# Patient Record
Sex: Female | Born: 1985 | Race: White | Hispanic: No | Smoking: Never smoker
Health system: Southern US, Community
[De-identification: ages and names within clinical notes are randomized; demographics above are authoritative.]

## PROBLEM LIST (undated history)

## (undated) DIAGNOSIS — G43909 Migraine, unspecified, not intractable, without status migrainosus: Secondary | ICD-10-CM

## (undated) HISTORY — DX: Migraine, unspecified, not intractable, without status migrainosus: G43.909

## (undated) HISTORY — PX: WISDOM TOOTH EXTRACTION: SHX21

---

## 2010-10-09 ENCOUNTER — Emergency Department (HOSPITAL_COMMUNITY)
Admission: EM | Admit: 2010-10-09 | Discharge: 2010-10-09 | Disposition: A | Discharge status: Home / Self Care | Payer: BC Managed Care – PPO | Attending: Emergency Medicine | Admitting: Emergency Medicine

## 2010-10-09 DIAGNOSIS — M79609 Pain in unspecified limb: Secondary | ICD-10-CM | POA: Insufficient documentation

## 2010-10-09 DIAGNOSIS — L03119 Cellulitis of unspecified part of limb: Secondary | ICD-10-CM | POA: Insufficient documentation

## 2010-10-09 DIAGNOSIS — M7989 Other specified soft tissue disorders: Secondary | ICD-10-CM | POA: Insufficient documentation

## 2010-10-09 DIAGNOSIS — L02619 Cutaneous abscess of unspecified foot: Secondary | ICD-10-CM | POA: Insufficient documentation

## 2010-10-09 LAB — CBC
Hemoglobin: 14.3 g/dL (ref 12.0–15.0)
MCHC: 34.7 g/dL (ref 30.0–36.0)
Platelets: 195 10*3/uL (ref 150–400)
RBC: 4.31 MIL/uL (ref 3.87–5.11)

## 2010-10-09 LAB — POCT I-STAT, CHEM 8
Glucose, Bld: 81 mg/dL (ref 70–99)
HCT: 43 % (ref 36.0–46.0)
Hemoglobin: 14.6 g/dL (ref 12.0–15.0)
Potassium: 4.2 mEq/L (ref 3.5–5.1)
Sodium: 139 mEq/L (ref 135–145)
TCO2: 24 mmol/L (ref 0–100)

## 2010-12-09 ENCOUNTER — Other Ambulatory Visit (HOSPITAL_COMMUNITY)
Admission: RE | Admit: 2010-12-09 | Discharge: 2010-12-09 | Disposition: A | Discharge status: Home / Self Care | Payer: BC Managed Care – PPO | Source: Ambulatory Visit | Attending: Family Medicine | Admitting: Family Medicine

## 2010-12-09 DIAGNOSIS — Z01419 Encounter for gynecological examination (general) (routine) without abnormal findings: Secondary | ICD-10-CM | POA: Insufficient documentation

## 2013-09-22 ENCOUNTER — Ambulatory Visit: Payer: BC Managed Care – PPO | Admitting: Family Medicine

## 2013-09-22 VITALS — BP 108/78 | HR 84 | Temp 97.7°F | Resp 16 | Ht 63.0 in | Wt 190.8 lb

## 2013-09-22 DIAGNOSIS — J40 Bronchitis, not specified as acute or chronic: Principal | ICD-10-CM

## 2013-09-22 DIAGNOSIS — J329 Chronic sinusitis, unspecified: Secondary | ICD-10-CM

## 2013-09-22 DIAGNOSIS — R059 Cough, unspecified: Secondary | ICD-10-CM

## 2013-09-22 DIAGNOSIS — R05 Cough: Secondary | ICD-10-CM

## 2013-09-22 MED ORDER — AZITHROMYCIN 250 MG PO TABS: ORAL_TABLET | ORAL | Status: DC

## 2013-09-22 NOTE — Progress Notes (Signed)
Subjective:    Patient ID: Jackie Price, female    DOB: 25-May-1985, 28 y.o.   MRN: [id number]  HPI Chief Complaint  Patient presents with  . Illness    cough and congestion x 10 days    This chart was scribed for Meredith Staggers, MD by Andrew Au, ED Scribe. This patient was seen in room 8 and the patient's care was started at 10:20 AM.  HPI Comments: Jackie Stuewe is a 28 y.o. female who presents to the [facility] complaining of cough and congestion onset 10 days. Pt reports symptoms started with cough and sore throat that later moved to her chest causing chest congestion. She has had nasal drainage consisting of yellow mucous. Pt states she has felt warm but is unsure if she has had a fever. She reports her head congestion is worse today. Pt states she has been making progress within the last week but symptoms worsened have in the past 2 days. Pt has taken nyquil for symptoms 1 day ago without relief to symptoms. Pt denies h/o sinus infections. She denies allergies.   Pt denies sick contacts.  There are no active problems to display for this patient.  History reviewed. No pertinent past medical history. Past Surgical History  Procedure Laterality Date  . Wisdom tooth extraction     Allergies no known allergies Prior to Admission medications   Medication Sig Start Date End Date Taking? Authorizing Provider  DM-Doxylamine-Acetaminophen (NYQUIL COLD & FLU PO) Take by mouth.   Yes Historical Provider, MD   History   Social History  . Marital Status: Married    Spouse Name: N/A    Number of Children: N/A  . Years of Education: N/A   Occupational History  . Not on file.   Social History Main Topics  . Smoking status: Never Smoker   . Smokeless tobacco: Not on file  . Alcohol Use: Yes  . Drug Use: No  . Sexual Activity: Not on file   Other Topics Concern  . Not on file   Social History Narrative  . No narrative on file   Review of Systems  HENT:  Positive for congestion, sinus pressure and sore throat.   Respiratory: Positive for cough.   Allergic/Immunologic: Negative for environmental allergies.      Objective:   Physical Exam  Vitals reviewed. Constitutional: She is oriented to person, place, and time. She appears well-developed and well-nourished. No distress.  HENT:  Head: Normocephalic and atraumatic.  Right Ear: Hearing, tympanic membrane, external ear and ear canal normal.  Left Ear: Hearing, tympanic membrane, external ear and ear canal normal.  Nose: Nose normal.  Mouth/Throat: Oropharynx is clear and moist. No oropharyngeal exudate.  Slight pressure in maxillary sinus but not painful  Eyes: Conjunctivae and EOM are normal. Pupils are equal, round, and reactive to light.  Cardiovascular: Normal rate, regular rhythm, normal heart sounds and intact distal pulses.   No murmur heard. Pulmonary/Chest: Effort normal and breath sounds normal. No respiratory distress. She has no wheezes. She has no rhonchi.  Neurological: She is alert and oriented to person, place, and time.  Skin: Skin is warm and dry. No rash noted.  Psychiatric: She has a normal mood and affect. Her behavior is normal.       Assessment & Plan:   1. Sinobronchitis   2. Cough    Suspected initial URI, with secondary sickening past 2 days, sinus and chest sx's.  No discreet sinus  ttp, less likely acute sinus infection - covered with Zpak, start mucinex, saline ns. Fluids, relative rest, rtc precautions.   Meds ordered this encounter  Medications  . DM-Doxylamine-Acetaminophen (NYQUIL COLD & FLU PO)    Sig: Take by mouth.  Marland Kitchen. azithromycin (ZITHROMAX) 250 MG tablet    Sig: Take 2 pills by mouth on day 1, then 1 pill by mouth per day on days 2 through 5.    Dispense:  6 each    Refill:  0  I personally performed the services described in this documentation, which was scribed in my presence. The recorded information has been reviewed and considered, and  addended by me as needed.

## 2013-09-22 NOTE — Patient Instructions (Signed)
Saline nasal spray atleast 4 times per day, over the counter mucinex or mucinex DM, drink plenty of fluids.  Return to the clinic or go to the nearest emergency room if any of your symptoms worsen or new symptoms occur. Sinusitis Sinusitis is redness, soreness, and swelling (inflammation) of the paranasal sinuses. Paranasal sinuses are air pockets within the bones of your face (beneath the eyes, the middle of the forehead, or above the eyes). In healthy paranasal sinuses, mucus is able to drain out, and air is able to circulate through them by way of your nose. However, when your paranasal sinuses are inflamed, mucus and air can become trapped. This can allow bacteria and other germs to grow and cause infection. Sinusitis can develop quickly and last only a short time (acute) or continue over a long period (chronic). Sinusitis that lasts for more than 12 weeks is considered chronic.  CAUSES  Causes of sinusitis include:  Allergies.  Structural abnormalities, such as displacement of the cartilage that separates your nostrils (deviated septum), which can decrease the air flow through your nose and sinuses and affect sinus drainage.  Functional abnormalities, such as when the small hairs (cilia) that line your sinuses and help remove mucus do not work properly or are not present. SYMPTOMS  Symptoms of acute and chronic sinusitis are the same. The primary symptoms are pain and pressure around the affected sinuses. Other symptoms include:  Upper toothache.  Earache.  Headache.  Bad breath.  Decreased sense of smell and taste.  A cough, which worsens when you are lying flat.  Fatigue.  Fever.  Thick drainage from your nose, which often is green and may contain pus (purulent).  Swelling and warmth over the affected sinuses. DIAGNOSIS  Your caregiver will perform a physical exam. During the exam, your caregiver may:  Look in your nose for signs of abnormal growths in your nostrils (nasal  polyps).  Tap over the affected sinus to check for signs of infection.  View the inside of your sinuses (endoscopy) with a special imaging device with a light attached (endoscope), which is inserted into your sinuses. If your caregiver suspects that you have chronic sinusitis, one or more of the following tests may be recommended:  Allergy tests.  Nasal culture A sample of mucus is taken from your nose and sent to a lab and screened for bacteria.  Nasal cytology A sample of mucus is taken from your nose and examined by your caregiver to determine if your sinusitis is related to an allergy. TREATMENT  Most cases of acute sinusitis are related to a viral infection and will resolve on their own within 10 days. Sometimes medicines are prescribed to help relieve symptoms (pain medicine, decongestants, nasal steroid sprays, or saline sprays).  However, for sinusitis related to a bacterial infection, your caregiver will prescribe antibiotic medicines. These are medicines that will help kill the bacteria causing the infection.  Rarely, sinusitis is caused by a fungal infection. In theses cases, your caregiver will prescribe antifungal medicine. For some cases of chronic sinusitis, surgery is needed. Generally, these are cases in which sinusitis recurs more than 3 times per year, despite other treatments. HOME CARE INSTRUCTIONS   Drink plenty of water. Water helps thin the mucus so your sinuses can drain more easily.  Use a humidifier.  Inhale steam 3 to 4 times a day (for example, sit in the bathroom with the shower running).  Apply a warm, moist washcloth to your face 3 to 4  times a day, or as directed by your caregiver.  Use saline nasal sprays to help moisten and clean your sinuses.  Take over-the-counter or prescription medicines for pain, discomfort, or fever only as directed by your caregiver. SEEK IMMEDIATE MEDICAL CARE IF:  You have increasing pain or severe headaches.  You have  nausea, vomiting, or drowsiness.  You have swelling around your face.  You have vision problems.  You have a stiff neck.  You have difficulty breathing. MAKE SURE YOU:   Understand these instructions.  Will watch your condition.  Will get help right away if you are not doing well or get worse. Document Released: 04/05/2005 Document Revised: 06/28/2011 Document Reviewed: 04/20/2011 [facility] Patient Information 2014 [place], [place].    Bronchitis Bronchitis is inflammation of the airways that extend from the windpipe into the lungs (bronchi). The inflammation often causes mucus to develop, which leads to a cough. If the inflammation becomes severe, it may cause shortness of breath. CAUSES  Bronchitis may be caused by:   Viral infections.   Bacteria.   Cigarette smoke.   Allergens, pollutants, and other irritants.  SIGNS AND SYMPTOMS  The most common symptom of bronchitis is a frequent cough that produces mucus. Other symptoms include:  Fever.   Body aches.   Chest congestion.   Chills.   Shortness of breath.   Sore throat.  DIAGNOSIS  Bronchitis is usually diagnosed through a medical history and physical exam. Tests, such as chest X-rays, are sometimes done to rule out other conditions.  TREATMENT  You may need to avoid contact with whatever caused the problem (smoking, for example). Medicines are sometimes needed. These may include:  Antibiotics. These may be prescribed if the condition is caused by bacteria.  Cough suppressants. These may be prescribed for relief of cough symptoms.   Inhaled medicines. These may be prescribed to help open your airways and make it easier for you to breathe.   Steroid medicines. These may be prescribed for those with recurrent (chronic) bronchitis. HOME CARE INSTRUCTIONS  Get plenty of rest.   Drink enough fluids to keep your urine clear or pale yellow (unless you have a medical condition that requires fluid  restriction). Increasing fluids may help thin your secretions and will prevent dehydration.   Only take over-the-counter or prescription medicines as directed by your health care provider.  Only take antibiotics as directed. Make sure you finish them even if you start to feel better.  Avoid secondhand smoke, irritating chemicals, and strong fumes. These will make bronchitis worse. If you are a smoker, quit smoking. Consider using nicotine gum or skin patches to help control withdrawal symptoms. Quitting smoking will help your lungs heal faster.   Put a cool-mist humidifier in your bedroom at night to moisten the air. This may help loosen mucus. Change the water in the humidifier daily. You can also run the hot water in your shower and sit in the bathroom with the door closed for 5 10 minutes.   Follow up with your health care provider as directed.   Wash your hands frequently to avoid catching bronchitis again or spreading an infection to others.  SEEK MEDICAL CARE IF: Your symptoms do not improve after 1 week of treatment.  SEEK IMMEDIATE MEDICAL CARE IF:  Your fever increases.  You have chills.   You have chest pain.   You have worsening shortness of breath.   You have bloody sputum.  You faint.  You have lightheadedness.  You have  a severe headache.   You vomit repeatedly. MAKE SURE YOU:   Understand these instructions.  Will watch your condition.  Will get help right away if you are not doing well or get worse. Document Released: 04/05/2005 Document Revised: 01/24/2013 Document Reviewed: 11/28/2012 [facility] Patient Information 2014 [place], [place].

## 2014-01-15 ENCOUNTER — Encounter: Payer: Self-pay | Admitting: Family Medicine

## 2014-01-15 ENCOUNTER — Ambulatory Visit (INDEPENDENT_AMBULATORY_CARE_PROVIDER_SITE_OTHER): Payer: BC Managed Care – PPO

## 2014-01-15 ENCOUNTER — Ambulatory Visit (INDEPENDENT_AMBULATORY_CARE_PROVIDER_SITE_OTHER): Payer: BC Managed Care – PPO | Admitting: Family Medicine

## 2014-01-15 VITALS — BP 108/80 | HR 91 | Temp 97.9°F | Resp 16 | Ht 63.0 in | Wt 205.2 lb

## 2014-01-15 DIAGNOSIS — R0789 Other chest pain: Secondary | ICD-10-CM

## 2014-01-15 DIAGNOSIS — R071 Chest pain on breathing: Secondary | ICD-10-CM

## 2014-01-15 NOTE — Progress Notes (Signed)
Is a 28 year old employee of [employer]. [facility] comes in with per week of right upper sternal chest pain, tender patient.  No pulmonary history such as asthma, smoking.  Patient has no history of trauma.  Patient has no history of rash in that area.  Objective: Patient mildly tender at the manubrial his on the right side. Her chest is clear. Skin is clear.  [facility] reading (PRIMARY) by  Dr. Milus GlazierLauenstein. Negative chest  Assessment: Mild pectoral strain versus costochondritis  Plan: Voltaren 5 mg twice a day.  Return as necessary  Signed, Elvina SidleKurt Makana Feigel M.D..

## 2014-11-20 ENCOUNTER — Encounter: Payer: Self-pay | Admitting: Obstetrics and Gynecology

## 2014-11-20 ENCOUNTER — Ambulatory Visit (INDEPENDENT_AMBULATORY_CARE_PROVIDER_SITE_OTHER): Payer: 59 | Admitting: Obstetrics and Gynecology

## 2014-11-20 VITALS — BP 122/80 | HR 80 | Resp 16 | Ht 63.0 in | Wt 207.0 lb

## 2014-11-20 DIAGNOSIS — Z01419 Encounter for gynecological examination (general) (routine) without abnormal findings: Secondary | ICD-10-CM | POA: Diagnosis not present

## 2014-11-20 DIAGNOSIS — Z833 Family history of diabetes mellitus: Secondary | ICD-10-CM

## 2014-11-20 DIAGNOSIS — Z3009 Encounter for other general counseling and advice on contraception: Secondary | ICD-10-CM

## 2014-11-20 DIAGNOSIS — R635 Abnormal weight gain: Secondary | ICD-10-CM | POA: Diagnosis not present

## 2014-11-20 DIAGNOSIS — Z0000 Encounter for general adult medical examination without abnormal findings: Secondary | ICD-10-CM | POA: Diagnosis not present

## 2014-11-20 DIAGNOSIS — Z124 Encounter for screening for malignant neoplasm of cervix: Secondary | ICD-10-CM | POA: Diagnosis not present

## 2014-11-20 DIAGNOSIS — G43109 Migraine with aura, not intractable, without status migrainosus: Secondary | ICD-10-CM | POA: Diagnosis not present

## 2014-11-20 DIAGNOSIS — R319 Hematuria, unspecified: Secondary | ICD-10-CM | POA: Diagnosis not present

## 2014-11-20 DIAGNOSIS — Z113 Encounter for screening for infections with a predominantly sexual mode of transmission: Secondary | ICD-10-CM

## 2014-11-20 LAB — POCT URINALYSIS DIPSTICK
BILIRUBIN UA: NEGATIVE
Glucose, UA: NEGATIVE
KETONES UA: NEGATIVE
LEUKOCYTES UA: NEGATIVE
Nitrite, UA: NEGATIVE
PROTEIN UA: NEGATIVE
Spec Grav, UA: 1.02
Urobilinogen, UA: NEGATIVE
pH, UA: 7

## 2014-11-20 LAB — POCT URINE PREGNANCY: Preg Test, Ur: NEGATIVE

## 2014-11-20 NOTE — Progress Notes (Signed)
Patient ID: Jackie Price, female   DOB: Oct 27, 1985, 29 y.o.   MRN: [id number] 29 y.o. No obstetric history on file. MarriedCaucasianF here for annual exam. Pt is wanting to get started on birth control. She use to take OCP but did not fell well on them. Pt also c/o migraines she feels is hormonal. She does have visual changes with her migraine, she has had some numbness in her hands and face. She hasn't seen a neurologist. Slightly aphasic. Migraines occur 2-3 x a week, complex about 2 x a month. The only thing that helps is going to bed.  Menses q month x 2-3 days, 1 day is heavy. Saturates a super tampon in 2 hours at most. She has midcycle spotting the last 3-4 months, lasts x 1 day.  Sexually active, same partner x 2 years. Using W/D for contraception.  She c/o a 50 lb weight gain in the last year. She has improved her exercise and diet, still gaining weight.   Patient's last menstrual period was 11/01/2014.          Sexually active: Yes.    The current method of family planning is none.    Exercising: Yes.    walking  Smoker:  no  Health Maintenance: Pap:  4 years ago WNL per patient History of abnormal Pap:  no MMG:  N/A Colonoscopy:  N/A BMD:   N/A TDaP:  2014 Screening Labs: labs drawn today, Hb today: 14.2, Urine today: +blood   reports that she has never smoked. She has never used smokeless tobacco. She reports that she drinks about 0.6 - 1.8 oz of alcohol per week. She reports that she does not use illicit drugs.  Past Medical History  Diagnosis Date  . Migraine     Past Surgical History  Procedure Laterality Date  . Wisdom tooth extraction      No current outpatient prescriptions on file.   No current facility-administered medications for this visit.    Family History  Problem Relation Age of Onset  . Diabetes Father   . Congestive Heart Failure Father   . Heart disease Father   . Heart disease Maternal Grandmother   . Hypertension Maternal Grandmother   .  Alzheimer's disease Maternal Grandmother   . Hypertension Maternal Grandfather   . Cancer Maternal Grandfather   . Liver cancer Maternal Grandfather    Dad with heart failure in his mid 19's. Has AODM, overweight. ROS:  Pertinent items are noted in HPI.  Otherwise, a comprehensive ROS was negative.  Exam:   BP 122/80 mmHg  Pulse 80  Resp 16  Ht 5\' 3"  (1.6 m)  Wt 207 lb (93.895 kg)  BMI 36.68 kg/m2  LMP 11/01/2014  Weight change: @WEIGHTCHANGE @ Height:   Height: 5\' 3"  (160 cm)  Ht Readings from Last 3 Encounters:  11/20/14 5\' 3"  (1.6 m)  01/15/14 5\' 3"  (1.6 m)  09/22/13 5\' 3"  (1.6 m)    General appearance: alert, cooperative and appears stated age Head: Normocephalic, without obvious abnormality, atraumatic Neck: no adenopathy, supple, symmetrical, trachea midline and thyroid normal to inspection and palpation Lungs: clear to auscultation bilaterally Breasts: normal appearance, no masses or tenderness Heart: regular rate and rhythm Abdomen: soft, non-tender; bowel sounds normal; no masses,  no organomegaly Extremities: extremities normal, atraumatic, no cyanosis or edema Skin: Skin color, texture, turgor normal. No rashes or lesions Lymph nodes: Cervical, supraclavicular, and axillary nodes normal. No abnormal inguinal nodes palpated Neurologic: Grossly normal   Pelvic: External genitalia:  no lesions              Urethra:  normal appearing urethra with no masses, tenderness or lesions              Bartholins and Skenes: normal                 Vagina: normal appearing vagina with normal color and discharge, no lesions              Cervix: no lesions              Pap taken: Yes.   Bimanual Exam:  Uterus:  normal size, contour, position, consistency, mobility, non-tender              Adnexa: normal adnexa and no mass, fullness, tenderness               Rectovaginal: Confirms               Anus:  normal sphincter tone, no lesions  Chaperone was present for exam.  A:  Well  Woman with normal exam  STD testing  Contraception management  Complex migraines with stroke like symptoms  Weight gain (50 lbs)  P:   Pap with reflex hpv  STD testing  TSH, Lipid panel, HgbA1C  Discussed options for contraception, she can't be on combination OCP's. We discussed the mini-pill and nexplanon. I think the best option for her would be the paragaurd IUD. Information given on the paragaurd and mirena  Condoms encouraged  Referral to Neurology  Cc: Neurology

## 2014-11-20 NOTE — Patient Instructions (Signed)
EXERCISE AND DIET:  We recommended that you start or continue a regular exercise program for good health. Regular exercise means any activity that makes your heart beat faster and makes you sweat.  We recommend exercising at least 30 minutes per day at least 3 days a week, preferably 4 or 5.  We also recommend a diet low in fat and sugar.  Inactivity, poor dietary choices and obesity can cause diabetes, heart attack, stroke, and kidney damage, among others.    ALCOHOL AND SMOKING:  Women should limit their alcohol intake to no more than 7 drinks/beers/glasses of wine (combined, not each!) per week. Moderation of alcohol intake to this level decreases your risk of breast cancer and liver damage. And of course, no recreational drugs are part of a healthy lifestyle.  And absolutely no smoking or even second hand smoke. Most people know smoking can cause heart and lung diseases, but did you know it also contributes to weakening of your bones? Aging of your skin?  Yellowing of your teeth and nails?    CALCIUM AND VITAMIN D:  Adequate intake of calcium and Vitamin D are recommended.  The recommendations for exact amounts of these supplements seem to change often, but generally speaking 600 mg of calcium (either carbonate or citrate) and 800 units of Vitamin D per day seems prudent. Certain women may benefit from higher intake of Vitamin D.  If you are among these women, your doctor will have told you during your visit.    PAP SMEARS:  Pap smears, to check for cervical cancer or precancers,  have traditionally been done yearly, although recent scientific advances have shown that most women can have pap smears less often.  However, every woman still should have a physical exam from her gynecologist every year. It will include a breast check, inspection of the vulva and vagina to check for abnormal growths or skin changes, a visual exam of the cervix, and then an exam to evaluate the size and shape of the uterus and  ovaries.  And after 27 years of age, a rectal exam is indicated to check for rectal cancers. We will also provide age appropriate advice regarding health maintenance, like when you should have certain vaccines, screening for sexually transmitted diseases, bone density testing, colonoscopy, mammograms, etc.   MAMMOGRAMS:  All women over 75 years old should have a yearly mammogram. Many facilities now offer a "3D" mammogram, which may cost around $50 extra out of pocket. If possible,  we recommend you accept the option to have the 3D mammogram performed.  It both reduces the number of women who will be called back for extra views which then turn out to be normal, and it is better than the routine mammogram at detecting truly abnormal areas.    COLONOSCOPY:  Colonoscopy to screen for colon cancer is recommended for all women at age 57.  We know, you hate the idea of the prep.  We agree, BUT, having colon cancer and not knowing it is worse!!  Colon cancer so often starts as a polyp that can be seen and removed at colonscopy, which can quite literally save your life!  And if your first colonoscopy is normal and you have no family history of colon cancer, most women don't have to have it again for 10 years.  Once every ten years, you can do something that may end up saving your life, right?  We will be happy to help you get it scheduled when you  are ready.  Be sure to check your insurance coverage so you understand how much it will cost.  It may be covered as a preventative service at no cost, but you should check your particular policy.     Intrauterine Device Information An intrauterine device (IUD) is inserted into your uterus to prevent pregnancy. There are two types of IUDs available:   Copper IUD--This type of IUD is wrapped in copper wire and is placed inside the uterus. Copper makes the uterus and fallopian tubes produce a fluid that kills sperm. The copper IUD can stay in place for 10 years.  Hormone  IUD--This type of IUD contains the hormone progestin (synthetic progesterone). The hormone thickens the cervical mucus and prevents sperm from entering the uterus. It also thins the uterine lining to prevent implantation of a fertilized egg. The hormone can weaken or kill the sperm that get into the uterus. One type of hormone IUD can stay in place for 5 years, and another type can stay in place for 3 years. Your health care provider will make sure you are a good candidate for a contraceptive IUD. Discuss with your health care provider the possible side effects.  ADVANTAGES OF AN INTRAUTERINE DEVICE  IUDs are highly effective, reversible, long acting, and low maintenance.   There are no estrogen-related side effects.   An IUD can be used when breastfeeding.   IUDs are not associated with weight gain.   The copper IUD works immediately after insertion.   The hormone IUD works right away if inserted within 7 days of your period starting. You will need to use a backup method of birth control for 7 days if the hormone IUD is inserted at any other time in your cycle.  The copper IUD does not interfere with your female hormones.   The hormone IUD can make heavy menstrual periods lighter and decrease cramping.   The hormone IUD can be used for 3 or 5 years.   The copper IUD can be used for 10 years. DISADVANTAGES OF AN INTRAUTERINE DEVICE  The hormone IUD can be associated with irregular bleeding patterns.   The copper IUD can make your menstrual flow heavier and more painful.   You may experience cramping and vaginal bleeding after insertion.  Document Released: 03/09/2004 Document Revised: 12/06/2012 Document Reviewed: 09/24/2012 [facility] Patient Information 2015 [place], [place]. This information is not intended to replace advice given to you by your health care provider. Make sure you discuss any questions you have with your health care provider.  Intrauterine Device  Insertion Most often, an intrauterine device (IUD) is inserted into the uterus to prevent pregnancy. There are 2 types of IUDs available:  Copper IUD--This type of IUD creates an environment that is not favorable to sperm survival. The mechanism of action of the copper IUD is not known for certain. It can stay in place for 10 years.  Hormone IUD--This type of IUD contains the hormone progestin (synthetic progesterone). The progestin thickens the cervical mucus and prevents sperm from entering the uterus, and it also thins the uterine lining. There is no evidence that the hormone IUD prevents implantation. One hormone IUD can stay in place for up to 5 years, and a different hormone IUD can stay in place for up to 3 years. An IUD is the most cost-effective birth control if left in place for the full duration. It may be removed at any time. LET [facility] CARE PROVIDER KNOW ABOUT:  Any allergies you  have.  All medicines you are taking, including vitamins, herbs, eye drops, creams, and over-the-counter medicines.  Previous problems you or members of your family have had with the use of anesthetics.  Any blood disorders you have.  Previous surgeries you have had.  Possibility of pregnancy.  Medical conditions you have. RISKS AND COMPLICATIONS  Generally, intrauterine device insertion is a safe procedure. However, as with any procedure, complications can occur. Possible complications include:  Accidental puncture (perforation) of the uterus.  Accidental placement of the IUD either in the muscle layer of the uterus (myometrium) or outside the uterus. If this happens, the IUD can be found essentially floating around the bowels and must be taken out surgically.  The IUD may fall out of the uterus (expulsion). This is more common in women who have recently had a child.   Pregnancy in the fallopian tube (ectopic).  Pelvic inflammatory disease (PID), which is infection of the uterus and  fallopian tubes. The risk of PID is slightly increased in the first 20 days after the IUD is placed, but the overall risk is still very low. BEFORE THE PROCEDURE  Schedule the IUD insertion for when you will have your menstrual period or right after, to make sure you are not pregnant. Placement of the IUD is better tolerated shortly after a menstrual cycle.  You may need to take tests or be examined to make sure you are not pregnant.  You may be required to take a pregnancy test.  You may be required to get checked for sexually transmitted infections (STIs) prior to placement. Placing an IUD in someone who has an infection can make the infection worse.  You may be given a pain reliever to take 1 or 2 hours before the procedure.  An exam will be performed to determine the size and position of your uterus.  Ask your health care provider about changing or stopping your regular medicines. PROCEDURE   A tool (speculum) is placed in the vagina. This allows your health care provider to see the lower part of the uterus (cervix).  The cervix is prepped with a medicine that lowers the risk of infection.  You may be given a medicine to numb each side of the cervix (intracervical or paracervical block). This is used to block and control any discomfort with insertion.  A tool (uterine sound) is inserted into the uterus to determine the length of the uterine cavity and the direction the uterus may be tilted.  A slim instrument (IUD inserter) is inserted through the cervical canal and into your uterus.  The IUD is placed in the uterine cavity and the insertion device is removed.  The nylon string that is attached to the IUD and used for eventual IUD removal is trimmed. It is trimmed so that it lays high in the vagina, just outside the cervix. AFTER THE PROCEDURE  You may have bleeding after the procedure. This is normal. It varies from light spotting for a few days to menstrual-like  bleeding.  You may have mild cramping. Document Released: 12/02/2010 Document Revised: 01/24/2013 Document Reviewed: 09/24/2012 [facility] Patient Information 2015 [place], [place]. This information is not intended to replace advice given to you by your health care provider. Make sure you discuss any questions you have with your health care provider.

## 2014-11-21 LAB — LIPID PANEL
Cholesterol: 150 mg/dL (ref 125–200)
HDL: 50 mg/dL (ref >=46)
LDL Cholesterol: 85 mg/dL (ref <130)
TRIGLYCERIDES: 73 mg/dL (ref <150)
Total CHOL/HDL Ratio: 3 Ratio (ref <=5.0)
VLDL: 15 mg/dL (ref <30)

## 2014-11-21 LAB — URINALYSIS, MICROSCOPIC ONLY
BACTERIA UA: NONE SEEN [HPF]
Casts: NONE SEEN [LPF]
Crystals: NONE SEEN [HPF]
WBC UA: NONE SEEN WBC/HPF (ref <=5)
YEAST: NONE SEEN [HPF]

## 2014-11-21 LAB — TSH: TSH: 0.798 u[IU]/mL (ref 0.350–4.500)

## 2014-11-21 LAB — HEMOGLOBIN, FINGERSTICK: Hemoglobin, fingerstick: 14.2 g/dL (ref 12.0–16.0)

## 2014-11-21 LAB — STD PANEL
HIV 1&2 Ab, 4th Generation: NONREACTIVE
Hepatitis B Surface Ag: NEGATIVE

## 2014-11-21 LAB — HEMOGLOBIN A1C
Hgb A1c MFr Bld: 5.2 % (ref <5.7)
Mean Plasma Glucose: 103 mg/dL (ref <117)

## 2014-11-21 LAB — HEPATITIS C ANTIBODY: HCV AB: NEGATIVE

## 2014-11-22 LAB — IPS N GONORRHOEA AND CHLAMYDIA BY PCR

## 2014-11-22 LAB — URINE CULTURE
Colony Count: NO GROWTH
Organism ID, Bacteria: NO GROWTH

## 2014-11-25 LAB — IPS PAP TEST WITH REFLEX TO HPV

## 2014-11-26 ENCOUNTER — Telehealth: Payer: Self-pay | Admitting: Obstetrics and Gynecology

## 2014-11-26 NOTE — Telephone Encounter (Signed)
Patient says she is returning rebecca's call.

## 2015-01-03 ENCOUNTER — Ambulatory Visit (INDEPENDENT_AMBULATORY_CARE_PROVIDER_SITE_OTHER): Payer: 59 | Admitting: Family Medicine

## 2015-01-03 ENCOUNTER — Ambulatory Visit (INDEPENDENT_AMBULATORY_CARE_PROVIDER_SITE_OTHER): Payer: 59

## 2015-01-03 VITALS — BP 115/80 | HR 74 | Temp 97.9°F | Resp 18 | Ht 62.0 in | Wt 240.6 lb

## 2015-01-03 DIAGNOSIS — S91209A Unspecified open wound of unspecified toe(s) with damage to nail, initial encounter: Secondary | ICD-10-CM | POA: Diagnosis not present

## 2015-01-03 DIAGNOSIS — M79675 Pain in left toe(s): Secondary | ICD-10-CM | POA: Diagnosis not present

## 2015-01-03 MED ORDER — TRAMADOL HCL 50 MG PO TABS
50.0000 mg | ORAL_TABLET | Freq: Four times a day (QID) | ORAL | Status: DC | PRN
Start: 2015-01-03 — End: 2015-01-17

## 2015-01-03 MED ORDER — IBUPROFEN 800 MG PO TABS: 800.0000 mg | ORAL_TABLET | Freq: Three times a day (TID) | ORAL | Status: DC | PRN

## 2015-01-03 NOTE — Patient Instructions (Signed)
Use the ibuprofen as needed for pain If your pain is more severe you can use the tramadol- however remember this will make you sleepy!  Let us know if you are not feeling better soon!  INGROWN TOENAIL . Keep area clean, dry and bandaged for 24 hours. . After 24 hours, remove outer bandage and leave yellow gauze in place. Nuala Alpha toe/foot in warm soapy water for 5-10 minutes, once daily for 5 days. Rebandage toe after each cleaning. . Continue soaks until yellow gauze falls off. . Notify the office if you experience any of the following signs of infection: Swelling, redness, pus drainage, streaking, fever > 101.0 F

## 2015-01-03 NOTE — Progress Notes (Signed)
[facility] [address] [place] [address] [phone]  Date:  01/03/2015   Name:  Jackie Price   DOB:  [birth year]   MRN:  [id number]  PCP:  No PCP Per Patient    Chief Complaint: Toe Injury   History of Present Illness:  Jackie Price is a 29 y.o. very pleasant female patient who presents with the following:  This am while wearing flip flops she accidentally jammed her left great toe into a cinderblock while outdoors.  She notes that the left great toenail was pulled up.  It is still on her toe but is not well attached and is painful  She is OW generally in good health   LMP was 9/13 Last tetanus shot was about 2 years ago at her hiring at [facility] per her report  There are no active problems to display for this patient.   Past Medical History  Diagnosis Date  . Migraine     Past Surgical History  Procedure Laterality Date  . Wisdom tooth extraction      Social History  Substance Use Topics  . Smoking status: Never Smoker   . Smokeless tobacco: Never Used  . Alcohol Use: 0.6 - 1.8 oz/week    1-3 Standard drinks or equivalent per week    Family History  Problem Relation Age of Onset  . Diabetes Father   . Congestive Heart Failure Father   . Heart disease Father   . Heart disease Maternal Grandmother   . Hypertension Maternal Grandmother   . Alzheimer's disease Maternal Grandmother   . Hypertension Maternal Grandfather   . Cancer Maternal Grandfather   . Liver cancer Maternal Grandfather     No Known Allergies  Medication list has been reviewed and updated.  No current outpatient prescriptions on file prior to visit.   No current facility-administered medications on file prior to visit.    Review of Systems:  As per HPI- otherwise negative.   Physical Examination: Filed Vitals:   01/03/15 0848  BP: 132/98  Pulse: 74  Temp: 97.9 F (36.6 C)  Resp: 18   Filed Vitals:   01/03/15 0848  Height: 5\' 2"  (1.575 m)   Weight: 240 lb 9.6 oz (109.135 kg)   Body mass index is 43.99 kg/(m^2). Ideal Body Weight: Weight in (lb) to have BMI = 25: 136.4  GEN: WDWN, NAD, Non-toxic, A & O x 3, looks well, overweight HEENT: Atraumatic, Normocephalic. Neck supple. No masses, No LAD. Ears and Nose: No external deformity. CV: RRR, No M/G/R. No JVD. No thrill. No extra heart sounds. PULM: CTA B, no wheezes, crackles, rhonchi. No retractions. No resp. distress. No accessory muscle use. EXTR: No c/c/e NEURO Normal gait.  PSYCH: Normally interactive. Conversant. Not depressed or anxious appearing.  Calm demeanor.  Left foot: the great toenail is attached to the bed only at the base.  It is tender, blood visible under nail.  No other wound on her foot or toe  [facility] reading (PRIMARY) by  Dr. Patsy Lager. Left foot:  Negative for fracture  LEFT FOOT - COMPLETE 3+ VIEW  COMPARISON: None.  FINDINGS: There is no evidence of fracture or dislocation. There is no evidence of arthropathy or other focal bone abnormality. Soft tissues are unremarkable.  IMPRESSION: No acute osseous finding Assessment and Plan: Great toe pain, left - Plan: DG Foot Complete Left, ibuprofen (ADVIL,MOTRIN) 800 MG tablet, traMADol (ULTRAM) 50 MG tablet  Avulsion of toenail, initial encounter  [address]  toenail removed per Raelyn Ensign, PA-C as per note.  Foot dressed, wound care discussed. Ibuprofen as needed, also rx for tramadol if more severe pain  Signed Abbe Amsterdam, MD

## 2015-01-03 NOTE — Progress Notes (Signed)
Verbal Consent Obtained. Digital Block with 4 cc 2% Lidocaine plain. Sterile Prep and Drape. Left great toenail lifted and excised. Xeroform placed. Cleansed and dressed.

## 2015-01-07 ENCOUNTER — Emergency Department (HOSPITAL_COMMUNITY)
Admission: EM | Admit: 2015-01-07 | Discharge: 2015-01-07 | Disposition: A | Discharge status: Home / Self Care | Payer: 59 | Attending: Emergency Medicine | Admitting: Emergency Medicine

## 2015-01-07 ENCOUNTER — Ambulatory Visit (INDEPENDENT_AMBULATORY_CARE_PROVIDER_SITE_OTHER): Payer: 59 | Admitting: Urgent Care

## 2015-01-07 ENCOUNTER — Encounter (HOSPITAL_COMMUNITY): Payer: Self-pay | Admitting: Emergency Medicine

## 2015-01-07 VITALS — BP 118/78 | HR 77 | Temp 98.2°F | Resp 17 | Ht 63.25 in | Wt 240.0 lb

## 2015-01-07 DIAGNOSIS — S91201D Unspecified open wound of right great toe with damage to nail, subsequent encounter: Secondary | ICD-10-CM | POA: Insufficient documentation

## 2015-01-07 DIAGNOSIS — Z8679 Personal history of other diseases of the circulatory system: Secondary | ICD-10-CM | POA: Insufficient documentation

## 2015-01-07 DIAGNOSIS — S91209D Unspecified open wound of unspecified toe(s) with damage to nail, subsequent encounter: Secondary | ICD-10-CM

## 2015-01-07 DIAGNOSIS — W228XXD Striking against or struck by other objects, subsequent encounter: Secondary | ICD-10-CM | POA: Diagnosis not present

## 2015-01-07 DIAGNOSIS — M79675 Pain in left toe(s): Secondary | ICD-10-CM

## 2015-01-07 DIAGNOSIS — M79674 Pain in right toe(s): Secondary | ICD-10-CM | POA: Diagnosis present

## 2015-01-07 DIAGNOSIS — S91209A Unspecified open wound of unspecified toe(s) with damage to nail, initial encounter: Secondary | ICD-10-CM

## 2015-01-07 MED ORDER — AMOXICILLIN-POT CLAVULANATE 875-125 MG PO TABS: 1.0000 | ORAL_TABLET | Freq: Two times a day (BID) | ORAL | Status: DC

## 2015-01-07 NOTE — Progress Notes (Signed)
    MRN: [id number] DOB: [birth year]  Subjective:   Jackie Price is a 29 y.o. female presenting for chief complaint of Follow-up  Reports 5 day history of left toe injury from stubbing her right great toe into a cinder block. She was seen here on 01/04/2015 this and had her toenail removed. Has tried Tramadol and Advil with some relief. Patient's primary concern today is that her swelling has not gone down and her pain continues. She has also had redness of her toe. Denies fever, drainage of pus or bleeding, reinjury. Denies any other aggravating or relieving factors, no other questions or concerns.  Besan has a current medication list which includes the following prescription(s): ibuprofen and tramadol. Also has No Known Allergies.  Jackie Price  has a past medical history of Migraine. Also  has past surgical history that includes Wisdom tooth extraction.  Objective:   Vitals: BP 118/78 mmHg  Pulse 77  Temp(Src) 98.2 F (36.8 C) (Oral)  Resp 17  Ht 5' 3.25" (1.607 m)  Wt 240 lb (108.863 kg)  BMI 42.15 kg/m2  SpO2 99%  LMP 12/31/2014  Physical Exam  Musculoskeletal:       Right ankle: She exhibits swelling. She exhibits normal range of motion, no ecchymosis, no deformity, no laceration and normal pulse. Tenderness (near her right great toe nail but without discharge or drainage).   Assessment and Plan :   1. Avulsion of toenail, initial encounter 2. Great toe pain, left - Will cover patient for infectious process with Augmentin due to this being a dirty wound. Patient is to start soaking her toe in warm water. Xeroform was left in place. I counseled her that this would come off on its own with soaks. She is to follow up in 2 days if there is no improvement.  Jackie Bamberg, PA-C [facility] [phone] 01/07/2015 6:20 PM

## 2015-01-07 NOTE — ED Notes (Signed)
Pt. accidentally scrapped her right great toe against the carpet this evening , she was seen at an urgent care today for a follow up on her injured right great toe last week .

## 2015-01-07 NOTE — ED Notes (Signed)
Patient transported to X-ray 

## 2015-01-07 NOTE — Discharge Instructions (Signed)
°  Nail Avulsion Injury °Nail avulsion means that you have lost the whole, or part of a nail. The nail will usually grow back in 2 to 6 months. If your injury damaged the growth center of the nail, the nail may be deformed, split, or not stuck to the nail bed. Sometimes the avulsed nail is stitched back in place. This provides temporary protection to the nail bed until the new nail grows in.  °HOME CARE INSTRUCTIONS  °· Raise (elevate) your injury as much as possible. °· Protect the injury and cover it with bandages (dressings) or splints as instructed. °· Change dressings as instructed. °SEEK MEDICAL CARE IF:  °· There is increasing pain, redness, or swelling. °· You cannot move your fingers or toes. °Document Released: 05/13/2004 Document Revised: 06/28/2011 Document Reviewed: 03/07/2009 °[employer]® Patient Information ©2015 [employer]. This information is not intended to replace advice given to you by your health care provider. Make sure you discuss any questions you have with your health care provider. ° ° °

## 2015-01-07 NOTE — ED Provider Notes (Signed)
CSN: [id number]     Arrival date & time 01/07/15  2054 History  This chart was scribed for Felicie Morn, NP, working with Pricilla Loveless, MD by Chestine Spore, ED Scribe. The patient was seen in room TR07C/TR07C at 9:42 PM.    Chief Complaint  Patient presents with  . Toe Pain      Patient is a 29 y.o. female presenting with toe pain. The history is provided by the patient. No language interpreter was used.  Toe Pain This is a recurrent problem. The problem occurs constantly. The problem has not changed since onset.Exacerbated by: movement. The symptoms are relieved by medications (tramadol ). Treatments tried: tramadol. The treatment provided moderate relief.    Jackie Price is a 29 y.o. female who presents to the Emergency Department complaining of right great toe pain onset tonight. She was seen at Urgent Care today for a f/u on her right great toes injury and she was placed on an abx. She notes that she injured her right great toe by hitting it on a cinder block on 01/03/15. She reports that she had her right great toenail removed at the urgent care. She notes that she scrapped her right great toe on carpet this evening and she opened the healing area from where she had her right great toenail removed. Pt is having associated symptoms of drainage. She denies color change, wound, rash, joint swelling, and any other symptoms.    Past Medical History  Diagnosis Date  . Migraine    Past Surgical History  Procedure Laterality Date  . Wisdom tooth extraction     Family History  Problem Relation Age of Onset  . Diabetes Father   . Congestive Heart Failure Father   . Heart disease Father   . Heart disease Maternal Grandmother   . Hypertension Maternal Grandmother   . Alzheimer's disease Maternal Grandmother   . Hypertension Maternal Grandfather   . Cancer Maternal Grandfather   . Liver cancer Maternal Grandfather    Social History  Substance Use Topics  . Smoking status: Never Smoker    . Smokeless tobacco: Never Used  . Alcohol Use: 0.6 - 1.8 oz/week    1-3 Standard drinks or equivalent per week   OB History    No data available     Review of Systems  Musculoskeletal: Positive for arthralgias (right great toe).  All other systems reviewed and are negative.     Allergies  Review of patient's allergies indicates no known allergies.  Home Medications   Prior to Admission medications   Medication Sig Start Date End Date Taking? Authorizing Provider  amoxicillin-clavulanate (AUGMENTIN) 875-125 MG per tablet Take 1 tablet by mouth 2 (two) times daily. 01/07/15   Wallis Bamberg, PA-C  ibuprofen (ADVIL,MOTRIN) 800 MG tablet Take 1 tablet (800 mg total) by mouth every 8 (eight) hours as needed. 01/03/15   Gwenlyn Found Copland, MD  traMADol (ULTRAM) 50 MG tablet Take 1 tablet (50 mg total) by mouth every 6 (six) hours as needed. 01/03/15   Pearline Cables, MD   LMP 12/31/2014 Physical Exam  Constitutional: She is oriented to person, place, and time. She appears well-developed and well-nourished. No distress.  HENT:  Head: Normocephalic and atraumatic.  Eyes: EOM are normal.  Neck: Neck supple.  Cardiovascular: Normal rate, regular rhythm and normal heart sounds.  Exam reveals no gallop and no friction rub.   No murmur heard. Pulmonary/Chest: Effort normal and breath sounds normal. No respiratory distress. She has  no wheezes. She has no rales.  Abdominal: Soft. There is no tenderness.  Musculoskeletal: Normal range of motion.  Right great toe: see picture below.  Neurological: She is alert and oriented to person, place, and time.  Skin: Skin is warm and dry.  Psychiatric: She has a normal mood and affect. Her behavior is normal.  Nursing note and vitals reviewed.      ED Course  Procedures (including critical care time) DIAGNOSTIC STUDIES: Oxygen Saturation is 99% on RA, nl by my interpretation.    COORDINATION OF CARE: 9:47 PM Discussed treatment plan with pt  at bedside which includes wound care and pt agreed to plan.   Labs Review Labs Reviewed - No data to display  Imaging Review No results found. I have personally reviewed and evaluated these images and lab results as part of my medical decision-making.   EKG Interpretation None      MDM   Final diagnoses:  None    Recheck of toenail avulsion after abrading nail bed on carpet at home today. No nail bed disruption. Soaked in betadine, dressed with xeroform. Is currently on antibiotic and has pain medication at home.  I personally performed the services described in this documentation, which was scribed in my presence. The recorded information has been reviewed and is accurate.    Felicie Morn, NP 01/08/15 1610  Pricilla Loveless, MD 01/10/15 0700

## 2015-01-07 NOTE — Patient Instructions (Signed)
Toenail Removal Toenails may need to be removed because of injury, infections, or to correct abnormal growth. A special non-stick bandage will likely be put tightly on your toe to prevent bleeding. Often times a new nail will grow back. Sometimes the new nail may be deformed. Most of the time when a nail is lost, it will gradually heal, but may be sensitive for a long time. HOME CARE INSTRUCTIONS   Keep your foot elevated to relieve pain and swelling. This will require lying in bed or on a couch with the leg on pillows or sitting in a recliner with the leg up. Walking or letting your leg dangle may increase swelling, slow healing, and cause throbbing pain.  Keep your bandage dry and clean.  Change your bandage in 24 hours.  After your bandage is changed, soak your foot in warm, soapy water for 10 to 20 minutes. Do this 3 times per day. This helps reduce pain and swelling. After soaking your foot apply a clean, dry bandage. Change your bandage if it is wet or dirty.  Only take over-the-counter or prescription medicines for pain, discomfort, or fever as directed by your caregiver.  See your caregiver as needed for problems. You might need a tetanus shot now if:  You have no idea when you had the last one.  You have never had a tetanus shot before.  The injured area had dirt in it. If you need a tetanus shot, and you decide not to get one, there is a rare chance of getting tetanus. Sickness from tetanus can be serious. If you did get a tetanus shot, your arm may swell, get red and warm to the touch at the shot site. This is common and not a problem. SEEK IMMEDIATE MEDICAL CARE IF:   You have increased pain, swelling, redness, warmth, drainage, or bleeding.  You have a fever.  You have swelling that spreads from your toe into your foot. Document Released: 01/02/2003 Document Revised: 06/28/2011 Document Reviewed: 04/15/2008 ExitCare Patient Information 2015 [employer]. This  information is not intended to replace advice given to you by your health care provider. Make sure you discuss any questions you have with your health care provider. 

## 2015-01-17 ENCOUNTER — Encounter: Payer: Self-pay | Admitting: Neurology

## 2015-01-17 ENCOUNTER — Ambulatory Visit (INDEPENDENT_AMBULATORY_CARE_PROVIDER_SITE_OTHER): Payer: 59 | Admitting: Neurology

## 2015-01-17 VITALS — BP 118/72 | HR 70 | Ht 63.0 in | Wt 237.3 lb

## 2015-01-17 DIAGNOSIS — G43109 Migraine with aura, not intractable, without status migrainosus: Secondary | ICD-10-CM

## 2015-01-17 DIAGNOSIS — R531 Weakness: Secondary | ICD-10-CM

## 2015-01-17 DIAGNOSIS — R208 Other disturbances of skin sensation: Secondary | ICD-10-CM | POA: Diagnosis not present

## 2015-01-17 DIAGNOSIS — R200 Anesthesia of skin: Secondary | ICD-10-CM

## 2015-01-17 DIAGNOSIS — R202 Paresthesia of skin: Secondary | ICD-10-CM

## 2015-01-17 DIAGNOSIS — R479 Unspecified speech disturbances: Secondary | ICD-10-CM

## 2015-01-17 MED ORDER — TOPIRAMATE 50 MG PO TABS: ORAL_TABLET | ORAL | Status: DC

## 2015-01-17 MED ORDER — NAPROXEN SODIUM 550 MG PO TABS: 550.0000 mg | ORAL_TABLET | Freq: Two times a day (BID) | ORAL | Status: DC | PRN

## 2015-01-17 NOTE — Patient Instructions (Addendum)
Migraine Recommendations: 1.  Start topiramate  tablet.  Take 1/2 tablet at bedtime for 7 days, then 1 tablet at bedtime.  Call in 4 weeks with update and we can adjust dose if needed  Possible side effects include: impaired thinking, sedation, paresthesias (numbness and tingling) and weight loss.  It may cause dehydration and there is a small risk for kidney stones, so make sure to stay hydrated with water during the day.  There is also a very small risk for glaucoma, so if you notice any change in your vision while taking this medication, see an ophthalmologist.  2.  Take naproxen  at earliest onset of headache.  May repeat dose once in 12 hours if needed. 3.  Limit use of pain relievers to no more than 2 days out of the week.  These medications include acetaminophen, ibuprofen, triptans and narcotics.  This will help reduce risk of rebound headaches. 4.  Be aware of common food triggers such as processed sweets, processed foods with nitrites (such as deli meat, hot dogs, sausages), foods with MSG, alcohol (such as wine), chocolate, certain cheeses, certain fruits (dried fruits, some citrus fruit), vinegar, diet soda. 4.  Avoid caffeine 5.  Routine exercise 6.  Proper sleep hygiene 7.  Stay adequately hydrated with water 8.  Keep a headache diary. 9.  Maintain proper stress management. 10.  Do not skip meals.  Healthy diet.  Weight loss. 11.  Consider supplements:  Magnesium oxide  to  daily, riboflavin , Coenzyme Q 10  three times daily 12.  Follow up in 3 months.  CALL IN 4 WEEKS WITH UPDATE. 13.  Will get MRI of the brain with and without contrast.    Your MRI is scheduled at [facility] 1st floor 01/22/2015 at 8:45pm  No Prep for test If you need to cal and reschedule this appointment please contact radiology at [phone]

## 2015-01-17 NOTE — Progress Notes (Signed)
NEUROLOGY CONSULTATION NOTE  Jackie Price MRN: [id number] DOB: 03-08-1986  Referring provider: Dr. Oscar La Primary care provider: Dr. Wynelle Link  Reason for consult:  migraine  HISTORY OF PRESENT ILLNESS: Jackie Price is a 29 year old right-handed female with morbid obesity who presents for migraines.  She has two types of migraines. Typical migraines since childhood involve pounding headache in center of forehead, and associated with nausea, vomiting, photophobia, phonophobia, and blurred vision or scotomas during the day.  It lasts all day.  It occurs two to three times without headache, but once a week, it is accompanied by the headache.  She is unable to function when she has these severe headaches.  Beginning a year ago, she developed a new headache.  It is preceded by numbness and tingling in either hand, that travels up the arm to the unilateral half of the face.  She thinks it is associated with upper extremity weakness but not facial weakness.  It is associated with speaking nonsensical speech and alexia.  It lasts about an hour and then the headache starts.  It is a dull 4-5/10 temporal pain (either side).  The headache lasts all day.  These headaches occur 1 to 2 times a month.  Current abortive therapy:  Excedrin, Advil Past abortive therapy:  Sumatriptan (but only took in highschool) Never was on a preventative medication.  Caffeine:  Coffee daily Alcohol:  occasionally Smoker:  no Diet:  Tries to eat healthy.  Drinks diet soda.  Drinks 2 to 3 bottles of water daily Exercise:  Not routine Depression/stress:  Stress over the past year due to divorce. Sleep hygiene:  good Family history of headache:  no  PAST MEDICAL HISTORY: Past Medical History  Diagnosis Date  . Migraine     PAST SURGICAL HISTORY: Past Surgical History  Procedure Laterality Date  . Wisdom tooth extraction      MEDICATIONS: No current outpatient prescriptions on file prior to visit.   No  current facility-administered medications on file prior to visit.    ALLERGIES: No Known Allergies  FAMILY HISTORY: Family History  Problem Relation Age of Onset  . Diabetes Father   . Congestive Heart Failure Father   . Heart disease Father   . Heart disease Maternal Grandmother   . Hypertension Maternal Grandmother   . Alzheimer's disease Maternal Grandmother   . Hypertension Maternal Grandfather   . Cancer Maternal Grandfather   . Liver cancer Maternal Grandfather     SOCIAL HISTORY: Social History   Social History  . Marital Status: Married    Spouse Name: N/A  . Number of Children: N/A  . Years of Education: N/A   Occupational History  . Not on file.   Social History Main Topics  . Smoking status: Never Smoker   . Smokeless tobacco: Never Used  . Alcohol Use: 0.6 - 1.8 oz/week    1-3 Standard drinks or equivalent per week  . Drug Use: No  . Sexual Activity:    Partners: Male   Other Topics Concern  . Not on file   Social History Narrative    REVIEW OF SYSTEMS: Constitutional: No fevers, chills, or sweats, no generalized fatigue, change in appetite Eyes: No visual changes, double vision, eye pain Ear, nose and throat: No hearing loss, ear pain, nasal congestion, sore throat Cardiovascular: No chest pain, palpitations Respiratory:  No shortness of breath at rest or with exertion, wheezes GastrointestinaI: No nausea, vomiting, diarrhea, abdominal pain, fecal incontinence Genitourinary:  No dysuria,  urinary retention or frequency Musculoskeletal:  No neck pain, back pain Integumentary: No rash, pruritus, skin lesions Neurological: as above Psychiatric: No depression, insomnia, anxiety Endocrine: No palpitations, fatigue, diaphoresis, mood swings, change in appetite, change in weight, increased thirst Hematologic/Lymphatic:  No anemia, purpura, petechiae. Allergic/Immunologic: no itchy/runny eyes, nasal congestion, recent allergic reactions,  rashes  PHYSICAL EXAM: Filed Vitals:   01/17/15 0812  BP: 118/72  Pulse: 70   General: No acute distress.  Patient appears well-groomed. Head:  Normocephalic/atraumatic Eyes:  fundi unremarkable, without vessel changes, exudates, hemorrhages or papilledema. Neck: supple, no paraspinal tenderness, full range of motion Back: No paraspinal tenderness Heart: regular rate and rhythm Lungs: Clear to auscultation bilaterally. Vascular: No carotid bruits. Neurological Exam: Mental status: alert and oriented to person, place, and time, recent and remote memory intact, fund of knowledge intact, attention and concentration intact, speech fluent and not dysarthric, language intact. Cranial nerves: CN I: not tested CN II: pupils equal, round and reactive to light, visual fields intact, fundi unremarkable, without vessel changes, exudates, hemorrhages or papilledema. CN III, IV, VI:  full range of motion, no nystagmus, no ptosis CN V: facial sensation intact CN VII: upper and lower face symmetric CN VIII: hearing intact CN IX, X: gag intact, uvula midline CN XI: sternocleidomastoid and trapezius muscles intact CN XII: tongue midline Bulk & Tone: normal, no fasciculations. Motor:  5/5 throughout Sensation: temperature and vibration sensation intact. Deep Tendon Reflexes:  2+ throughout, toes downgoing.  Finger to nose testing:  Without dysmetria.  Heel to shin:  Without dysmetria.  Gait:  Normal station and stride.  Able to turn and tandem walk. Romberg negative.  IMPRESSION: Migraine with aura.  She reports possible weakness, so hemiplegic migraine is considered.  PLAN: 1.  Since she has a fairly new onset of atypical migraine associated with focal symptoms (speech disturbance, unilateral numbness and weakness), we will get MRI of the brain with and without contrast to rule out secondary intracranial causes of these atypical symptoms. 2.  Start topiramate  at bedtime and increase to   at bedtime in 7 days.   3.  Will try naproxen  for abortive therapy, since it is long acting.  Due to possible hemiplegia, will hold off on starting a triptan. 4.  Lifestyle modification, including diet and weight loss. 5.  Follow up in 3 months.  Call us in 4 weeks with update.  45 minutes spent face to face with patient, over 50% spent discussing diagnosis and plan.  Thank you for allowing me to take part in the care of this patient.  Shon Millet, DO  CC:  Deatra James, MD  Gertie Exon, MD

## 2015-01-22 ENCOUNTER — Ambulatory Visit (HOSPITAL_COMMUNITY)
Admission: RE | Admit: 2015-01-22 | Discharge: 2015-01-22 | Disposition: A | Discharge status: Home / Self Care | Payer: 59 | Source: Ambulatory Visit | Attending: Neurology | Admitting: Neurology

## 2015-01-22 DIAGNOSIS — G43109 Migraine with aura, not intractable, without status migrainosus: Secondary | ICD-10-CM | POA: Diagnosis not present

## 2015-01-22 DIAGNOSIS — R208 Other disturbances of skin sensation: Secondary | ICD-10-CM | POA: Insufficient documentation

## 2015-01-22 DIAGNOSIS — R202 Paresthesia of skin: Secondary | ICD-10-CM | POA: Diagnosis present

## 2015-01-22 DIAGNOSIS — R200 Anesthesia of skin: Secondary | ICD-10-CM

## 2015-01-22 MED ORDER — GADOBENATE DIMEGLUMINE 529 MG/ML IV SOLN
20.0000 mL | Freq: Once | INTRAVENOUS | Status: AC | PRN
  Administered 2015-01-22: 20 mL via INTRAVENOUS

## 2015-01-23 ENCOUNTER — Telehealth: Payer: Self-pay | Admitting: *Deleted

## 2015-01-23 NOTE — Telephone Encounter (Signed)
Notes Recorded by Drema Dallas, DO on 01/23/2015 at 9:27 AM MRI of brain is normal   Details

## 2015-01-24 NOTE — Telephone Encounter (Signed)
Per DPR Ok to leave detailed message on patients phone.... Left message that results are normal and to contact the office if she has any questions

## 2015-05-01 ENCOUNTER — Ambulatory Visit: Payer: 59 | Admitting: Neurology

## 2015-10-22 ENCOUNTER — Encounter (HOSPITAL_COMMUNITY): Payer: Self-pay | Admitting: *Deleted

## 2015-10-22 ENCOUNTER — Inpatient Hospital Stay (HOSPITAL_COMMUNITY)
Admission: AD | Admit: 2015-10-22 | Discharge: 2015-10-22 | Disposition: A | Discharge status: Home / Self Care | Payer: 59 | Source: Ambulatory Visit | Attending: Obstetrics and Gynecology | Admitting: Obstetrics and Gynecology

## 2015-10-22 DIAGNOSIS — R109 Unspecified abdominal pain: Secondary | ICD-10-CM | POA: Diagnosis not present

## 2015-10-22 DIAGNOSIS — N926 Irregular menstruation, unspecified: Secondary | ICD-10-CM | POA: Insufficient documentation

## 2015-10-22 DIAGNOSIS — G43909 Migraine, unspecified, not intractable, without status migrainosus: Secondary | ICD-10-CM | POA: Insufficient documentation

## 2015-10-22 DIAGNOSIS — O209 Hemorrhage in early pregnancy, unspecified: Secondary | ICD-10-CM | POA: Diagnosis present

## 2015-10-22 DIAGNOSIS — Z3202 Encounter for pregnancy test, result negative: Secondary | ICD-10-CM | POA: Diagnosis not present

## 2015-10-22 DIAGNOSIS — N939 Abnormal uterine and vaginal bleeding, unspecified: Secondary | ICD-10-CM | POA: Diagnosis present

## 2015-10-22 LAB — CBC
HCT: 37.3 % (ref 36.0–46.0)
Hemoglobin: 13.3 g/dL (ref 12.0–15.0)
MCH: 32.5 pg (ref 26.0–34.0)
MCHC: 35.7 g/dL (ref 30.0–36.0)
MCV: 91.2 fL (ref 78.0–100.0)
PLATELETS: 237 10*3/uL (ref 150–400)
RBC: 4.09 MIL/uL (ref 3.87–5.11)
RDW: 12.6 % (ref 11.5–15.5)
WBC: 11.7 10*3/uL — AB (ref 4.0–10.5)

## 2015-10-22 LAB — URINALYSIS, ROUTINE W REFLEX MICROSCOPIC
Bilirubin Urine: NEGATIVE
Glucose, UA: NEGATIVE mg/dL
KETONES UR: NEGATIVE mg/dL
LEUKOCYTES UA: NEGATIVE
Nitrite: NEGATIVE
PROTEIN: NEGATIVE mg/dL
Specific Gravity, Urine: 1.025 (ref 1.005–1.030)
pH: 5.5 (ref 5.0–8.0)

## 2015-10-22 LAB — URINE MICROSCOPIC-ADD ON

## 2015-10-22 LAB — ABO/RH: ABO/RH(D): O POS

## 2015-10-22 LAB — HCG, QUANTITATIVE, PREGNANCY

## 2015-10-22 LAB — POCT PREGNANCY, URINE: PREG TEST UR: NEGATIVE

## 2015-10-22 NOTE — MAU Note (Signed)
+  HPT this morning.  Around 10, started spotting.  Now almost feels like she is on her period, increased bleeding and cramping.

## 2015-10-22 NOTE — Discharge Instructions (Signed)
Human Chorionic Gonadotropin Test °Human chorionic gonadotropin (hCG) is a hormone produced during pregnancy by the cells that form the placenta. The placenta is the organ that grows inside your womb (uterus) to nourish a developing baby. When you are pregnant, hCG starts to appear in your blood about 11 days after conception. It continues to go up for the first 8-11 weeks of pregnancy.  °Your hCG level can be measured with several different types of tests. You may have: °· A urine test. °¨ hCG is eliminated from your body by your kidneys, so a urine test is one way to check for this hormone. °¨ A urine test only shows whether there is hCG in your urine. It does not measure how much. °¨ You may have a urine test to find out whether you are pregnant. °¨ A home pregnancy test detects whether there is hCG in your urine. °· A qualitative blood test. °¨ Like the urine test, this blood test only shows whether there is hCG in your blood. It does not measure how much. °¨ You may have this type of blood test to find out whether you are pregnant. °· A quantitative blood test. °¨ This type of blood test measures the amount of hCG in your blood. °¨ You may have this type of test to diagnose an abnormal pregnancy or determine whether you are at risk of, or have had, a failed pregnancy (miscarriage). °PREPARATION FOR TEST °For the urine test: °· Limit your fluid intake before the urine test as directed by your health care provider. °· Collect the sample the first time you urinate in the morning. °· Let your health care provider know if you have blood in your urine. This may interfere with the test result. °Some medicines may interfere with the urine and blood tests. Let your health care provider know about all the medicines you are taking. No additional preparation is required for the blood test.  °RESULTS °It is your responsibility to obtain your test results. Ask the lab or department performing the test when and how you will  get your results. Talk to your health care provider if you have any questions about your test results. °The results of the hCG urine test and the qualitative hCG blood test are either positive or negative. The results of the quantitative hCG blood test are reported as a number. hCG is measured in international units per liter (IU/L). °Meaning of Negative Test Results °A negative result on a urine or qualitative blood test could mean that you are not pregnant. It could also mean the test was done too early to detect hCG. If you still have other signs of pregnancy, the test should be repeated. °Meaning of Positive Test Results °A positive result on the urine or qualitative blood tests means you are most likely pregnant. Your health care provider may confirm your pregnancy with an imaging study of the inside of your uterus at 5-6 weeks (ultrasound).  °Range of Normal Values °Ranges for normal values for the quantitative hCG blood test may vary among different labs and hospitals. You should always check with your health care provider after having lab work or other tests done to discuss whether your values are considered within normal limits.  °· Less than 5 IU/L means it is most likely you are not pregnant. °· Greater than 25 IU/L means it is most likely you are pregnant. °Meaning of Results Outside Normal Value Ranges °If your hCG level on the quantitative test is not   what would be expected, you may have the test again. It may also be important for your health care provider to know whether your hCG level goes up or down over time. Common causes of results outside the normal range include:  °· Being pregnant with twins (hCG level is higher than expected). °· Having an ectopic pregnancy (hCG rises more slowly than expected). °· Miscarriage (hCG level falls). °· Abnormal growths in the womb (hCG level is higher than expected). °  °This information is not intended to replace advice given to you by your health care  provider. Make sure you discuss any questions you have with your health care provider. °  °Document Released: 05/07/2004 Document Revised: 04/26/2014 Document Reviewed: 07/10/2013 °Elsevier Interactive Patient Education ©2016 Elsevier Inc. ° °

## 2015-10-22 NOTE — MAU Provider Note (Signed)
History     CSN: [id number]  Arrival date and time: 10/22/15 1741   None     Chief Complaint  Patient presents with  . Possible Pregnancy  . Vaginal Bleeding  . Abdominal Cramping   HPI   Ms. Jackie Price is 30 y.o. here with abdominal pain and vaginal bleeding. She was due to start her period on June 15 and she didn't start. At 0600 this morning she took a a pregnancy test at home which read "pregnant" and at 1000 this morning she started bleeding. The bleeding is similar to her menstrual cycle, although heavier.   In May she was 2 weeks late to start her period, and then she missed her period in June. She has regular periods every month.   OB History    No data available      Past Medical History  Diagnosis Date  . Migraine     Past Surgical History  Procedure Laterality Date  . Wisdom tooth extraction      Family History  Problem Relation Age of Onset  . Diabetes Father   . Congestive Heart Failure Father   . Heart disease Father   . Heart disease Maternal Grandmother   . Hypertension Maternal Grandmother   . Alzheimer's disease Maternal Grandmother   . Hypertension Maternal Grandfather   . Cancer Maternal Grandfather   . Liver cancer Maternal Grandfather     Social History  Substance Use Topics  . Smoking status: Never Smoker   . Smokeless tobacco: Never Used  . Alcohol Use: 0.6 - 1.8 oz/week    1-3 Standard drinks or equivalent per week    Allergies: No Known Allergies  Prescriptions prior to admission  Medication Sig Dispense Refill Last Dose  . naproxen sodium (ANAPROX) 550 MG tablet Take 1 tablet (550 mg total) by mouth 2 (two) times daily as needed. 30 tablet 2   . topiramate (TOPAMAX) 50 MG tablet Take 0.5tab at bedtime x7days, then 1tab at bedtime. 30 tablet 0    Results for orders placed or performed during the hospital encounter of 10/22/15 (from the past 48 hour(s))  Urinalysis, Routine w reflex microscopic ([facility])     Status:  Abnormal   Collection Time: 10/22/15  5:55 PM  Result Value Ref Range   Color, Urine YELLOW YELLOW   APPearance CLEAR CLEAR   Specific Gravity, Urine 1.025 1.005 - 1.030   pH 5.5 5.0 - 8.0   Glucose, UA NEGATIVE NEGATIVE mg/dL   Hgb urine dipstick LARGE (A) NEGATIVE   Bilirubin Urine NEGATIVE NEGATIVE   Ketones, ur NEGATIVE NEGATIVE mg/dL   Protein, ur NEGATIVE NEGATIVE mg/dL   Nitrite NEGATIVE NEGATIVE   Leukocytes, UA NEGATIVE NEGATIVE  Urine microscopic-add on     Status: Abnormal   Collection Time: 10/22/15  5:55 PM  Result Value Ref Range   Squamous Epithelial / LPF 0-5 (A) NONE SEEN   WBC, UA 0-5 0 - 5 WBC/hpf   RBC / HPF 6-30 0 - 5 RBC/hpf   Bacteria, UA FEW (A) NONE SEEN  Pregnancy, urine POC     Status: None   Collection Time: 10/22/15  6:04 PM  Result Value Ref Range   Preg Test, Ur NEGATIVE NEGATIVE    Comment:        THE SENSITIVITY OF THIS METHODOLOGY IS >24 mIU/mL   CBC     Status: Abnormal   Collection Time: 10/22/15  6:36 PM  Result Value Ref Range  WBC 11.7 (H) 4.0 - 10.5 K/uL   RBC 4.09 3.87 - 5.11 MIL/uL   Hemoglobin 13.3 12.0 - 15.0 g/dL   HCT 09.837.3 11.936.0 - 14.746.0 %   MCV 91.2 78.0 - 100.0 fL   MCH 32.5 26.0 - 34.0 pg   MCHC 35.7 30.0 - 36.0 g/dL   RDW 82.912.6 56.211.5 - 13.015.5 %   Platelets 237 150 - 400 K/uL  ABO/Rh     Status: None   Collection Time: 10/22/15  6:36 PM  Result Value Ref Range   ABO/RH(D) O POS   hCG, quantitative, pregnancy     Status: None   Collection Time: 10/22/15  6:36 PM  Result Value Ref Range   hCG, Beta Chain, Quant, S <1 <5 mIU/mL    Comment:          GEST. AGE      CONC.  (mIU/mL)   <=1 WEEK        5 - 50     2 WEEKS       50 - 500     3 WEEKS       100 - 10,000     4 WEEKS     1,000 - 30,000     5 WEEKS     3,500 - 115,000   6-8 WEEKS     12,000 - 270,000    12 WEEKS     15,000 - 220,000        FEMALE AND NON-PREGNANT FEMALE:     LESS THAN 5 mIU/mL REPEATED TO VERIFY     Review of Systems  Gastrointestinal:  Positive for abdominal pain (abdominal cramping. ).   Physical Exam   Blood pressure 118/73, pulse 67, temperature 98.4 F (36.9 C), temperature source Oral, resp. rate 18, height 5\' 3"  (1.6 m), weight 252 lb 6.4 oz (114.488 kg), last menstrual period 09/14/2015.  Physical Exam  Constitutional: She is oriented to person, place, and time. She appears well-developed and well-nourished. No distress.  HENT:  Head: Normocephalic.  Respiratory: Effort normal.  GI: Soft. She exhibits no distension. There is no tenderness. There is no rebound and no guarding.  Musculoskeletal: Normal range of motion.  Neurological: She is alert and oriented to person, place, and time.  Skin: Skin is warm. She is not diaphoretic.  Psychiatric: Her behavior is normal.    MAU Course  Procedures  None  MDM  Urine pregnancy test Hcg level negative   Assessment and Plan   A:  1. Encounter for pregnancy test with result negative   2. Irregular menstrual cycle     P:  Discharge home in stable condition  Likely false positive pregnancy test Bleeding precautions Return to MAU for emergencies   Duane LopeJennifer I Marisella Puccio, NP 10/22/2015 8:57 PM

## 2016-03-16 DIAGNOSIS — F4323 Adjustment disorder with mixed anxiety and depressed mood: Secondary | ICD-10-CM | POA: Diagnosis not present

## 2016-03-24 DIAGNOSIS — F4323 Adjustment disorder with mixed anxiety and depressed mood: Secondary | ICD-10-CM | POA: Diagnosis not present

## 2016-09-14 DIAGNOSIS — N912 Amenorrhea, unspecified: Secondary | ICD-10-CM | POA: Diagnosis not present

## 2016-09-24 DIAGNOSIS — Z3491 Encounter for supervision of normal pregnancy, unspecified, first trimester: Secondary | ICD-10-CM | POA: Diagnosis not present

## 2016-09-24 LAB — OB RESULTS CONSOLE HEPATITIS B SURFACE ANTIGEN: Hepatitis B Surface Ag: NEGATIVE

## 2016-09-24 LAB — OB RESULTS CONSOLE GC/CHLAMYDIA
CHLAMYDIA, DNA PROBE: NEGATIVE
GC PROBE AMP, GENITAL: NEGATIVE

## 2016-09-24 LAB — OB RESULTS CONSOLE RUBELLA ANTIBODY, IGM: RUBELLA: NON-IMMUNE/NOT IMMUNE

## 2016-09-24 LAB — OB RESULTS CONSOLE RPR: RPR: NONREACTIVE

## 2016-09-24 LAB — OB RESULTS CONSOLE HIV ANTIBODY (ROUTINE TESTING): HIV: NONREACTIVE

## 2016-09-24 LAB — OB RESULTS CONSOLE ANTIBODY SCREEN: Antibody Screen: NEGATIVE

## 2016-09-24 LAB — OB RESULTS CONSOLE ABO/RH: RH Type: POSITIVE

## 2016-09-29 DIAGNOSIS — Z3687 Encounter for antenatal screening for uncertain dates: Secondary | ICD-10-CM | POA: Diagnosis not present

## 2016-09-29 DIAGNOSIS — Z3A01 Less than 8 weeks gestation of pregnancy: Secondary | ICD-10-CM | POA: Diagnosis not present

## 2016-09-29 DIAGNOSIS — O3680X0 Pregnancy with inconclusive fetal viability, not applicable or unspecified: Secondary | ICD-10-CM | POA: Diagnosis not present

## 2016-09-29 DIAGNOSIS — Z3491 Encounter for supervision of normal pregnancy, unspecified, first trimester: Secondary | ICD-10-CM | POA: Diagnosis not present

## 2016-10-26 DIAGNOSIS — Z3A10 10 weeks gestation of pregnancy: Secondary | ICD-10-CM | POA: Diagnosis not present

## 2016-10-26 DIAGNOSIS — Z3491 Encounter for supervision of normal pregnancy, unspecified, first trimester: Secondary | ICD-10-CM | POA: Diagnosis not present

## 2016-10-26 DIAGNOSIS — O3680X0 Pregnancy with inconclusive fetal viability, not applicable or unspecified: Secondary | ICD-10-CM | POA: Diagnosis not present

## 2016-11-15 DIAGNOSIS — O4691 Antepartum hemorrhage, unspecified, first trimester: Secondary | ICD-10-CM | POA: Diagnosis not present

## 2016-11-15 DIAGNOSIS — N939 Abnormal uterine and vaginal bleeding, unspecified: Secondary | ICD-10-CM | POA: Diagnosis not present

## 2016-11-15 DIAGNOSIS — Z3A13 13 weeks gestation of pregnancy: Secondary | ICD-10-CM | POA: Diagnosis not present

## 2016-12-23 DIAGNOSIS — Z3492 Encounter for supervision of normal pregnancy, unspecified, second trimester: Secondary | ICD-10-CM | POA: Diagnosis not present

## 2016-12-28 DIAGNOSIS — Z6841 Body Mass Index (BMI) 40.0 and over, adult: Secondary | ICD-10-CM | POA: Diagnosis not present

## 2016-12-29 ENCOUNTER — Encounter (HOSPITAL_COMMUNITY): Payer: Self-pay | Admitting: *Deleted

## 2016-12-30 ENCOUNTER — Ambulatory Visit (HOSPITAL_COMMUNITY)
Admission: RE | Admit: 2016-12-30 | Discharge: 2016-12-30 | Disposition: A | Discharge status: Home / Self Care | Payer: 59 | Source: Ambulatory Visit | Attending: Obstetrics and Gynecology | Admitting: Obstetrics and Gynecology

## 2016-12-30 ENCOUNTER — Ambulatory Visit (HOSPITAL_COMMUNITY): Admission: RE | Admit: 2016-12-30 | Payer: 59 | Source: Ambulatory Visit | Attending: Cardiology | Admitting: Cardiology

## 2016-12-30 ENCOUNTER — Encounter (HOSPITAL_COMMUNITY): Payer: Self-pay

## 2016-12-30 ENCOUNTER — Other Ambulatory Visit (HOSPITAL_COMMUNITY): Payer: Self-pay | Admitting: Obstetrics and Gynecology

## 2016-12-30 ENCOUNTER — Other Ambulatory Visit (HOSPITAL_COMMUNITY): Payer: Self-pay | Admitting: *Deleted

## 2016-12-30 VITALS — BP 114/70 | HR 68 | Wt 258.2 lb

## 2016-12-30 DIAGNOSIS — Z3689 Encounter for other specified antenatal screening: Secondary | ICD-10-CM

## 2016-12-30 DIAGNOSIS — Z8249 Family history of ischemic heart disease and other diseases of the circulatory system: Secondary | ICD-10-CM | POA: Insufficient documentation

## 2016-12-30 DIAGNOSIS — Z3A19 19 weeks gestation of pregnancy: Secondary | ICD-10-CM

## 2016-12-30 DIAGNOSIS — Q872 Congenital malformation syndromes predominantly involving limbs: Secondary | ICD-10-CM

## 2016-12-30 DIAGNOSIS — Z8279 Family history of other congenital malformations, deformations and chromosomal abnormalities: Secondary | ICD-10-CM

## 2016-12-30 DIAGNOSIS — O99212 Obesity complicating pregnancy, second trimester: Secondary | ICD-10-CM

## 2016-12-30 DIAGNOSIS — Z8489 Family history of other specified conditions: Secondary | ICD-10-CM | POA: Insufficient documentation

## 2016-12-30 NOTE — Progress Notes (Signed)
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 The FOB has Berkshire HathawayHolt Oram Syndrome with minimal manifestations. He has no diagnosed cardiac abnormalites and lateral phalangeal deformation. His sister is extensively affected, as is their paternal grandmother. [facility]. She is reporting no problems with the pregnancy otherwise OB History: OB History    Gravida Para Term Preterm AB Living   1             SAB TAB Ectopic Multiple Live Births                  PMH:  Past Medical History:  Diagnosis Date  . Migraine     PSH:  Past Surgical History:  Procedure Laterality Date  . WISDOM TOOTH EXTRACTION     Meds: See EPIC section Allergies: NKDA FH: See EPIC section Soc: See EPIC section  Review of Systems: no vaginal bleeding or cramping/contractions, no LOF, no nausea/vomiting. All other systems reviewed and are negative.  PE:  VS: See EPIC section GEN: well-appearing female ABD: gravid, NT  Please see separate document for fetal ultrasound report.  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 I have asked her to return in 4 weeks for a repeat evaluation. I have also suggested she contact [facility] and let them know her MFM US showed an atrial abnormality, and they might wish to see her sooner than her scheduled appointment. She and her husband were offered a visit with our genetic counselor today, but they declined  Thank you for the opportunity to be a part of the care of Jackie Price. Please contact our office if we can be of further assistance.   I spent approximately 30 minutes with this  patient with over 50% of time spent in face-to-face counseling.

## 2017-01-25 DIAGNOSIS — Q872 Congenital malformation syndromes predominantly involving limbs: Secondary | ICD-10-CM | POA: Diagnosis not present

## 2017-01-27 ENCOUNTER — Ambulatory Visit (HOSPITAL_COMMUNITY)
Admission: RE | Admit: 2017-01-27 | Discharge: 2017-01-27 | Disposition: A | Discharge status: Home / Self Care | Payer: 59 | Source: Ambulatory Visit | Attending: Obstetrics and Gynecology | Admitting: Obstetrics and Gynecology

## 2017-01-27 ENCOUNTER — Encounter (HOSPITAL_COMMUNITY): Payer: Self-pay

## 2017-01-27 VITALS — BP 126/66 | HR 84 | Wt 258.5 lb

## 2017-01-27 DIAGNOSIS — O359XX0 Maternal care for (suspected) fetal abnormality and damage, unspecified, not applicable or unspecified: Secondary | ICD-10-CM | POA: Insufficient documentation

## 2017-01-27 DIAGNOSIS — O35BXX0 Maternal care for other (suspected) fetal abnormality and damage, fetal cardiac anomalies, not applicable or unspecified: Secondary | ICD-10-CM

## 2017-01-27 DIAGNOSIS — Z362 Encounter for other antenatal screening follow-up: Secondary | ICD-10-CM | POA: Insufficient documentation

## 2017-01-27 DIAGNOSIS — Q248 Other specified congenital malformations of heart: Secondary | ICD-10-CM | POA: Diagnosis not present

## 2017-01-27 DIAGNOSIS — Z8249 Family history of ischemic heart disease and other diseases of the circulatory system: Secondary | ICD-10-CM

## 2017-01-27 DIAGNOSIS — Z3A23 23 weeks gestation of pregnancy: Secondary | ICD-10-CM | POA: Diagnosis not present

## 2017-01-27 DIAGNOSIS — Z8489 Family history of other specified conditions: Secondary | ICD-10-CM | POA: Insufficient documentation

## 2017-01-27 DIAGNOSIS — Q872 Congenital malformation syndromes predominantly involving limbs: Secondary | ICD-10-CM | POA: Insufficient documentation

## 2017-01-27 DIAGNOSIS — O358XX0 Maternal care for other (suspected) fetal abnormality and damage, not applicable or unspecified: Secondary | ICD-10-CM

## 2017-01-31 ENCOUNTER — Encounter (HOSPITAL_COMMUNITY): Payer: Self-pay

## 2017-02-16 DIAGNOSIS — Z3402 Encounter for supervision of normal first pregnancy, second trimester: Secondary | ICD-10-CM | POA: Diagnosis not present

## 2017-02-16 DIAGNOSIS — Z23 Encounter for immunization: Secondary | ICD-10-CM | POA: Diagnosis not present

## 2017-02-16 DIAGNOSIS — Z3A26 26 weeks gestation of pregnancy: Secondary | ICD-10-CM | POA: Diagnosis not present

## 2017-02-28 DIAGNOSIS — Z369 Encounter for antenatal screening, unspecified: Secondary | ICD-10-CM | POA: Diagnosis not present

## 2017-02-28 NOTE — Addendum Note (Signed)
Encounter addended by: Particia Nearingecker, Hatem Cull, MD on: 02/28/2017 9:59 AM  Actions taken: Visit diagnoses modified, Order list changed, Diagnosis association updated

## 2017-03-04 ENCOUNTER — Encounter (HOSPITAL_COMMUNITY): Payer: Self-pay

## 2017-03-04 ENCOUNTER — Ambulatory Visit (HOSPITAL_COMMUNITY)
Admission: RE | Admit: 2017-03-04 | Discharge: 2017-03-04 | Disposition: A | Discharge status: Home / Self Care | Payer: 59 | Source: Ambulatory Visit | Attending: Obstetrics and Gynecology | Admitting: Obstetrics and Gynecology

## 2017-03-04 ENCOUNTER — Other Ambulatory Visit (HOSPITAL_COMMUNITY): Payer: Self-pay | Admitting: *Deleted

## 2017-03-04 DIAGNOSIS — Q249 Congenital malformation of heart, unspecified: Secondary | ICD-10-CM

## 2017-03-04 DIAGNOSIS — Z8489 Family history of other specified conditions: Secondary | ICD-10-CM | POA: Diagnosis not present

## 2017-03-04 DIAGNOSIS — O35BXX0 Maternal care for other (suspected) fetal abnormality and damage, fetal cardiac anomalies, not applicable or unspecified: Secondary | ICD-10-CM

## 2017-03-04 DIAGNOSIS — Z3A28 28 weeks gestation of pregnancy: Secondary | ICD-10-CM | POA: Insufficient documentation

## 2017-03-04 DIAGNOSIS — Z362 Encounter for other antenatal screening follow-up: Secondary | ICD-10-CM | POA: Diagnosis not present

## 2017-03-04 DIAGNOSIS — Z8249 Family history of ischemic heart disease and other diseases of the circulatory system: Secondary | ICD-10-CM | POA: Diagnosis not present

## 2017-03-04 DIAGNOSIS — O99213 Obesity complicating pregnancy, third trimester: Secondary | ICD-10-CM | POA: Diagnosis not present

## 2017-03-04 DIAGNOSIS — O358XX0 Maternal care for other (suspected) fetal abnormality and damage, not applicable or unspecified: Secondary | ICD-10-CM | POA: Insufficient documentation

## 2017-03-14 DIAGNOSIS — Z3483 Encounter for supervision of other normal pregnancy, third trimester: Secondary | ICD-10-CM | POA: Diagnosis not present

## 2017-03-14 DIAGNOSIS — Z3A30 30 weeks gestation of pregnancy: Secondary | ICD-10-CM | POA: Diagnosis not present

## 2017-03-14 DIAGNOSIS — O2613 Low weight gain in pregnancy, third trimester: Secondary | ICD-10-CM | POA: Diagnosis not present

## 2017-03-29 DIAGNOSIS — O99213 Obesity complicating pregnancy, third trimester: Secondary | ICD-10-CM | POA: Diagnosis not present

## 2017-03-29 DIAGNOSIS — Z3483 Encounter for supervision of other normal pregnancy, third trimester: Secondary | ICD-10-CM | POA: Diagnosis not present

## 2017-03-29 DIAGNOSIS — Z3A32 32 weeks gestation of pregnancy: Secondary | ICD-10-CM | POA: Diagnosis not present

## 2017-03-31 DIAGNOSIS — Z3A28 28 weeks gestation of pregnancy: Secondary | ICD-10-CM | POA: Diagnosis not present

## 2017-03-31 DIAGNOSIS — O358XX0 Maternal care for other (suspected) fetal abnormality and damage, not applicable or unspecified: Secondary | ICD-10-CM | POA: Diagnosis not present

## 2017-03-31 DIAGNOSIS — Z3A32 32 weeks gestation of pregnancy: Secondary | ICD-10-CM | POA: Diagnosis not present

## 2017-04-04 DIAGNOSIS — Z3483 Encounter for supervision of other normal pregnancy, third trimester: Secondary | ICD-10-CM | POA: Diagnosis not present

## 2017-04-04 DIAGNOSIS — Z3A33 33 weeks gestation of pregnancy: Secondary | ICD-10-CM | POA: Diagnosis not present

## 2017-04-04 DIAGNOSIS — O99213 Obesity complicating pregnancy, third trimester: Secondary | ICD-10-CM | POA: Diagnosis not present

## 2017-04-13 DIAGNOSIS — O43129 Velamentous insertion of umbilical cord, unspecified trimester: Secondary | ICD-10-CM | POA: Diagnosis not present

## 2017-04-13 DIAGNOSIS — O99213 Obesity complicating pregnancy, third trimester: Secondary | ICD-10-CM | POA: Diagnosis not present

## 2017-04-13 DIAGNOSIS — Z3A34 34 weeks gestation of pregnancy: Secondary | ICD-10-CM | POA: Diagnosis not present

## 2017-04-15 ENCOUNTER — Inpatient Hospital Stay (HOSPITAL_COMMUNITY)
Admission: RE | Admit: 2017-04-15 | Discharge: 2017-04-15 | Disposition: A | Discharge status: Home / Self Care | Payer: 59 | Source: Ambulatory Visit

## 2017-04-15 ENCOUNTER — Encounter (HOSPITAL_COMMUNITY): Payer: Self-pay

## 2017-04-22 DIAGNOSIS — Z3687 Encounter for antenatal screening for uncertain dates: Secondary | ICD-10-CM | POA: Diagnosis not present

## 2017-04-22 LAB — OB RESULTS CONSOLE GBS: GBS: POSITIVE

## 2017-05-16 ENCOUNTER — Inpatient Hospital Stay (HOSPITAL_COMMUNITY): Payer: No Typology Code available for payment source | Admitting: Anesthesiology

## 2017-05-16 ENCOUNTER — Other Ambulatory Visit: Payer: Self-pay

## 2017-05-16 ENCOUNTER — Encounter (HOSPITAL_COMMUNITY): Payer: Self-pay

## 2017-05-16 ENCOUNTER — Inpatient Hospital Stay (HOSPITAL_COMMUNITY)
Admission: AD | Admit: 2017-05-16 | Discharge: 2017-05-20 | DRG: 788 | Disposition: A | Discharge status: Home / Self Care | Payer: No Typology Code available for payment source | Source: Ambulatory Visit | Attending: Obstetrics & Gynecology | Admitting: Obstetrics & Gynecology

## 2017-05-16 DIAGNOSIS — O99824 Streptococcus B carrier state complicating childbirth: Secondary | ICD-10-CM | POA: Diagnosis present

## 2017-05-16 DIAGNOSIS — O42919 Preterm premature rupture of membranes, unspecified as to length of time between rupture and onset of labor, unspecified trimester: Secondary | ICD-10-CM | POA: Diagnosis not present

## 2017-05-16 DIAGNOSIS — O99214 Obesity complicating childbirth: Secondary | ICD-10-CM | POA: Diagnosis present

## 2017-05-16 DIAGNOSIS — O358XX0 Maternal care for other (suspected) fetal abnormality and damage, not applicable or unspecified: Secondary | ICD-10-CM | POA: Diagnosis present

## 2017-05-16 DIAGNOSIS — Z3A39 39 weeks gestation of pregnancy: Secondary | ICD-10-CM | POA: Diagnosis not present

## 2017-05-16 DIAGNOSIS — O4292 Full-term premature rupture of membranes, unspecified as to length of time between rupture and onset of labor: Secondary | ICD-10-CM | POA: Diagnosis present

## 2017-05-16 DIAGNOSIS — Z3490 Encounter for supervision of normal pregnancy, unspecified, unspecified trimester: Secondary | ICD-10-CM

## 2017-05-16 LAB — AMNISURE RUPTURE OF MEMBRANE (ROM) NOT AT ARMC: Amnisure ROM: POSITIVE

## 2017-05-16 LAB — TYPE AND SCREEN
ABO/RH(D): O POS
Antibody Screen: NEGATIVE

## 2017-05-16 LAB — CBC
HCT: 38.2 % (ref 36.0–46.0)
HEMOGLOBIN: 13.3 g/dL (ref 12.0–15.0)
MCH: 32.7 pg (ref 26.0–34.0)
MCHC: 34.8 g/dL (ref 30.0–36.0)
MCV: 93.9 fL (ref 78.0–100.0)
Platelets: 189 10*3/uL (ref 150–400)
RBC: 4.07 MIL/uL (ref 3.87–5.11)
RDW: 13.8 % (ref 11.5–15.5)
WBC: 11.9 10*3/uL — ABNORMAL HIGH (ref 4.0–10.5)

## 2017-05-16 LAB — POCT FERN TEST

## 2017-05-16 MED ORDER — SOD CITRATE-CITRIC ACID 500-334 MG/5ML PO SOLN
30.0000 mL | ORAL | Status: DC | PRN
  Administered 2017-05-17: 30 mL via ORAL
  Filled 2017-05-16: qty 15

## 2017-05-16 MED ORDER — OXYCODONE-ACETAMINOPHEN 5-325 MG PO TABS: 2.0000 | ORAL_TABLET | ORAL | Status: DC | PRN

## 2017-05-16 MED ORDER — LIDOCAINE HCL (PF) 1 % IJ SOLN: 30.0000 mL | INTRAMUSCULAR | Status: DC | PRN

## 2017-05-16 MED ORDER — TERBUTALINE SULFATE 1 MG/ML IJ SOLN
0.2500 mg | Freq: Once | INTRAMUSCULAR | Status: DC | PRN
  Filled 2017-05-16: qty 1

## 2017-05-16 MED ORDER — PENICILLIN G POTASSIUM 5000000 UNITS IJ SOLR
5.0000 10*6.[IU] | Freq: Once | INTRAVENOUS | Status: AC
  Administered 2017-05-16: 5 10*6.[IU] via INTRAVENOUS
  Filled 2017-05-16: qty 5

## 2017-05-16 MED ORDER — ONDANSETRON HCL 4 MG/2ML IJ SOLN
4.0000 mg | Freq: Four times a day (QID) | INTRAMUSCULAR | Status: DC | PRN
  Administered 2017-05-17 (×2): 4 mg via INTRAVENOUS
  Filled 2017-05-16: qty 2

## 2017-05-16 MED ORDER — LACTATED RINGERS IV SOLN: 500.0000 mL | Freq: Once | INTRAVENOUS | Status: DC

## 2017-05-16 MED ORDER — PHENYLEPHRINE 40 MCG/ML (10ML) SYRINGE FOR IV PUSH (FOR BLOOD PRESSURE SUPPORT)
80.0000 ug | PREFILLED_SYRINGE | INTRAVENOUS | Status: DC | PRN
  Filled 2017-05-16 (×2): qty 10

## 2017-05-16 MED ORDER — PHENYLEPHRINE 40 MCG/ML (10ML) SYRINGE FOR IV PUSH (FOR BLOOD PRESSURE SUPPORT): 80.0000 ug | PREFILLED_SYRINGE | INTRAVENOUS | Status: DC | PRN

## 2017-05-16 MED ORDER — FENTANYL CITRATE (PF) 100 MCG/2ML IJ SOLN: 50.0000 ug | INTRAMUSCULAR | Status: DC | PRN

## 2017-05-16 MED ORDER — LACTATED RINGERS IV SOLN
500.0000 mL | INTRAVENOUS | Status: DC | PRN
  Administered 2017-05-17: 300 mL via INTRAVENOUS

## 2017-05-16 MED ORDER — DIPHENHYDRAMINE HCL 50 MG/ML IJ SOLN: 12.5000 mg | INTRAMUSCULAR | Status: DC | PRN

## 2017-05-16 MED ORDER — OXYTOCIN 40 UNITS IN LACTATED RINGERS INFUSION - SIMPLE MED
1.0000 m[IU]/min | INTRAVENOUS | Status: DC
  Administered 2017-05-16: 4 m[IU]/min via INTRAVENOUS
  Administered 2017-05-16: 2 m[IU]/min via INTRAVENOUS
  Administered 2017-05-16: 6 m[IU]/min via INTRAVENOUS

## 2017-05-16 MED ORDER — OXYTOCIN BOLUS FROM INFUSION: 500.0000 mL | Freq: Once | INTRAVENOUS | Status: DC

## 2017-05-16 MED ORDER — FENTANYL 2.5 MCG/ML BUPIVACAINE 1/10 % EPIDURAL INFUSION (WH - ANES)
INTRAMUSCULAR | Status: AC
  Filled 2017-05-16: qty 100

## 2017-05-16 MED ORDER — ACETAMINOPHEN 325 MG PO TABS: 650.0000 mg | ORAL_TABLET | ORAL | Status: DC | PRN

## 2017-05-16 MED ORDER — OXYTOCIN 40 UNITS IN LACTATED RINGERS INFUSION - SIMPLE MED
2.5000 [IU]/h | INTRAVENOUS | Status: DC
  Filled 2017-05-16: qty 1000

## 2017-05-16 MED ORDER — OXYCODONE-ACETAMINOPHEN 5-325 MG PO TABS: 1.0000 | ORAL_TABLET | ORAL | Status: DC | PRN

## 2017-05-16 MED ORDER — PHENYLEPHRINE 40 MCG/ML (10ML) SYRINGE FOR IV PUSH (FOR BLOOD PRESSURE SUPPORT)
PREFILLED_SYRINGE | INTRAVENOUS | Status: AC
  Filled 2017-05-16: qty 10

## 2017-05-16 MED ORDER — EPHEDRINE 5 MG/ML INJ: 10.0000 mg | INTRAVENOUS | Status: DC | PRN

## 2017-05-16 MED ORDER — PENICILLIN G POT IN DEXTROSE 60000 UNIT/ML IV SOLN
3.0000 10*6.[IU] | INTRAVENOUS | Status: DC
  Administered 2017-05-16 – 2017-05-17 (×3): 3 10*6.[IU] via INTRAVENOUS
  Filled 2017-05-16 (×5): qty 50

## 2017-05-16 MED ORDER — LIDOCAINE HCL (PF) 1 % IJ SOLN
INTRAMUSCULAR | Status: DC | PRN
  Administered 2017-05-16 (×2): 5 mL

## 2017-05-16 MED ORDER — FENTANYL 2.5 MCG/ML BUPIVACAINE 1/10 % EPIDURAL INFUSION (WH - ANES)
14.0000 mL/h | INTRAMUSCULAR | Status: DC | PRN
  Administered 2017-05-16 – 2017-05-17 (×2): 14 mL/h via EPIDURAL
  Filled 2017-05-16: qty 100

## 2017-05-16 MED ORDER — LACTATED RINGERS IV SOLN
INTRAVENOUS | Status: DC
  Administered 2017-05-16 – 2017-05-17 (×3): via INTRAVENOUS

## 2017-05-16 NOTE — MAU Provider Note (Signed)
S: Ms. Jackie Price is a 32 y.o. G1P0 at 4938w2d  who presents to MAU today complaining of leaking of fluid since yesterday. She denies vaginal bleeding. She endorses contractions. She reports normal fetal movement.    O: BP 133/83   Pulse 68   Temp 98.3 F (36.8 C) (Oral)   Ht 5\' 3"  (1.6 m)   Wt 276 lb 8 oz (125.4 kg)   LMP 08/07/2016   SpO2 98%   BMI 48.98 kg/m  GENERAL: Well-developed, well-nourished female in no acute distress.  HEAD: Normocephalic, atraumatic.  CHEST: Normal effort of breathing, regular heart rate ABDOMEN: Soft, nontender, gravid  Cervical exam:  Dilation: 1.5 Effacement (%): 50 Cervical Position: Middle Station: Ballotable Presentation: Vertex Exam by:: Charna Archerj mcclellan, RN   Fetal Monitoring: Baseline: 120 bpm Variability: Moderate  Accelerations: 15x15 Decelerations: None Contractions: None  Results for orders placed or performed during the hospital encounter of 05/16/17 (from the past 24 hour(s))  Fern Test     Status: None   Collection Time: 05/16/17 12:27 PM  Result Value Ref Range   POCT Fern Test    Amnisure rupture of membrane (rom)not at [facility]     Status: None   Collection Time: 05/16/17  1:52 PM  Result Value Ref Range   Amnisure ROM POSITIVE      A: SIUP at 5838w2d  SROM: Amnisure positive   P: Report given to RN to contact MD on call for further instructions  Tresean Mattix, Harolyn RutherfordJennifer I, NP 05/16/2017 2:19 PM

## 2017-05-16 NOTE — Progress Notes (Addendum)
G1 @ 39.[redacted] wksga. Here dt possible ROM. States ROM at midnight last night. Nurse advice line informed pt to wait til morning to call her provider since pt not ctx. Denies bleeding. +FM. EFM applied.   SVE 1.5/50/ballotable  Fern test done. Equivocal. Provider made aware.   1356: amnisure ordered and done   1423: provider notified. Report status of pt given. Orders received to admit and place admission orders and GBS prophylaxis.   Admission orders placed with GBS prophylaxis.   1430: Pt to birthing via wheelchair

## 2017-05-16 NOTE — Progress Notes (Signed)
Jackie Price MRN: [id number]  Subjective: -Care assumed of 32 y.o. G1P0 at 8049w2d who presents for SROM with augmentation. Patient with AROM around midnight on 1/28 with minimal contractions.  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  In room to meet acquaintance of patient and family.  Patient reports comfort with epidural and denies HA, Visual disturbances, and RUQ pain.  Patient has no q/c at current.   Objective: BP 119/73   Pulse 69   Temp 98.1 F (36.7 C) (Axillary)   Resp 16   Ht 5\' 3"  (1.6 m)   Wt 125.2 kg (276 lb)   LMP 08/07/2016   SpO2 98%   BMI 48.89 kg/m  No intake/output data recorded. No intake/output data recorded.  Fetal Monitoring: FHT: 115 bpm, Mod Var, -Decels, +Accels UC: Q1-503min, graphs mild, unable to palpate    Physical Exam: General appearance: alert, well appearing, and in no distress. Chest: normal rate and regular rhythm.  clear to auscultation, no wheezes, rales or rhonchi, symmetric air entry. Abdominal exam: soft, nontender, nondistended, no masses or organomegaly. Extremities: Edema Skin exam: Warm Dry  Vaginal Exam: SVE:   Dilation: 3 Effacement (%): 80 Station: -1 Exam by:: Rachel MouldsLakishia Stubbs, RN Membranes:SROM x 21 hrs Internal Monitors: None  Augmentation/Induction: Pitocin:6712mUn/min Cytotec: None  Assessment:  IUP at 39.2wks Cat I FT  Labor Augmentation Fetal Cardiac Anomaly  Plan: -Discussed FHT and reassurances given -Discussed low threshold for fetal distress in presence of cardiac anomaly -Discussed potentially long labor time d/t first baby and early stage with augmentation -No q/c -Continue other mgmt as ordered -Dr. Carmela HurtE. Kulwa updated on [address]FHT  Jackie Reid L Briena Swingler,MSN, CNM 05/16/2017, 8:44 PM

## 2017-05-16 NOTE — Progress Notes (Addendum)
Jackie Price MRN: [id number]  Subjective: -Patient resting in bed.  No complaints  Objective: BP 126/74   Pulse 82   Temp 98.1 F (36.7 C) (Axillary)   Resp 18   Ht 5\' 3"  (1.6 m)   Wt 125.2 kg (276 lb)   LMP 08/07/2016   SpO2 98%   BMI 48.89 kg/m  No intake/output data recorded. No intake/output data recorded.  Fetal Monitoring: FHT: 120 bpm, Mod Var, -Decels, +Accels UC: Q1-432min, palpates moderate    Vaginal Exam: SVE:   Dilation: 3.5 Effacement (%): 80 Station: -2 Exam by:: J.Matison Nuccio, CNM Membranes:SROM x 23hrs Internal Monitors: IUPC inserted  Augmentation/Induction: Pitocin:8614mUn/min  Cytotec: None  Assessment:  IUP at 39.2wks Cat I FT  Labor Augmentation IUPC Insertion  Plan: -Discussed IUPC r/b, prior to insertion, including increased risk of infection and ability to adequately monitor quantity and strength of contractions. -Patient agrees, no q/c -IUPC inserted without issues -Continue other mgmt as ordered  Valma CavaJessica L Kirandeep Fariss,MSN, CNM 05/16/2017, 11:55 PM  Addendum (0010) Nurse call reports patient with repetitive subtle late decelerations.  Strip reviewed and frequent strong contractions noted  MVUs 270mmHG  Decrease pitocin to 327mUn/min and monitor Titrate by 291mUn/min as appropriate Improvement of FHT noted with interventions  Continue other mgmt as necessary  Cherre RobinsJessica L Sakinah Rosamond MSN, CNM

## 2017-05-16 NOTE — Anesthesia Procedure Notes (Signed)
Epidural Patient location during procedure: OB  Staffing Anesthesiologist: Phillips Groutarignan, Aaidyn San, MD Performed: anesthesiologist   Preanesthetic Checklist Completed: patient identified, site marked, surgical consent, pre-op evaluation, timeout performed, IV checked, risks and benefits discussed and monitors and equipment checked  Epidural Patient position: sitting Prep: DuraPrep Patient monitoring: heart rate, continuous pulse ox and blood pressure Approach: right paramedian Location: L4-L5 Injection technique: LOR saline  Needle:  Needle type: Tuohy  Needle gauge: 17 G Needle length: 9 cm and 9 Needle insertion depth: 7 cm Catheter type: closed end flexible Catheter size: 20 Guage Catheter at skin depth: 12 cm Test dose: negative  Assessment Events: blood not aspirated, injection not painful, no injection resistance, negative IV test and no paresthesia  Additional Notes Patient identified. Risks/Benefits/Options discussed with patient including but not limited to bleeding, infection, nerve damage, paralysis, failed block, incomplete pain control, headache, blood pressure changes, nausea, vomiting, reactions to medication both or allergic, itching and postpartum back pain. Confirmed with bedside nurse the patient's most recent platelet count. Confirmed with patient that they are not currently taking any anticoagulation, have any bleeding history or any family history of bleeding disorders. Patient expressed understanding and wished to proceed. All questions were answered. Sterile technique was used throughout the entire procedure. Please see nursing notes for vital signs. Test dose was given through epidural needle and negative prior to continuing to dose epidural or start infusion. Warning signs of high block given to the patient including shortness of breath, tingling/numbness in hands, complete motor block, or any concerning symptoms with instructions to call for help. Patient was given  instructions on fall risk and not to get out of bed. All questions and concerns addressed with instructions to call with any issues.

## 2017-05-16 NOTE — MAU Note (Signed)
Urine in lab with culture tube 

## 2017-05-16 NOTE — Progress Notes (Signed)
Definition of Holt-Oram Syndrome  Holt-Oram syndrome is an autosomal dominant disorder characterized by upper limb defects (deformities of the radius, carpal bones, and/or thumbs) and cardiac septal defects, most commonly a secundum ASD  Cardiac conduction disturbances, including complete heart block, are also common. Holt-Oram syndrome is genetically heterogeneous; mutations in the TBX5 gene are the most common cause [17]. Other genes that have been linked to familial isolated ASDs include GATA4, MYH6, and NKX2-5   Dina Mobley STACIA

## 2017-05-16 NOTE — Anesthesia Pain Management Evaluation Note (Signed)
  CRNA Pain Management Visit Note  Patient: Jackie Price, 32 y.o., female  "Hello I am a member of the anesthesia team at [facility]. We have an anesthesia team available at all times to provide care throughout the hospital, including epidural management and anesthesia for C-section. I don't know your plan for the delivery whether it a natural birth, water birth, IV sedation, nitrous supplementation, doula or epidural, but we want to meet your pain goals."   1.Was your pain managed to your expectations on prior hospitalizations?   No prior hospitalizations  2.What is your expectation for pain management during this hospitalization?     Epidural  3.How can we help you reach that goal?   Record the patient's initial score and the patient's pain goal.   Pain: 3  Pain Goal: 7 [facility] wants you to be able to say your pain was always managed very well.  Jackie Price,[name] 05/16/2017

## 2017-05-16 NOTE — MAU Note (Signed)
Large gush at midnight. Continues to leak clear fluid, no pain, no bleeding. No recent exams.  Denies problems with preg

## 2017-05-16 NOTE — Anesthesia Preprocedure Evaluation (Addendum)
Anesthesia Evaluation  Patient identified by MRN, date of birth, ID band Patient awake    Reviewed: Allergy & Precautions, H&P , NPO status , Patient's Chart, lab work & pertinent test results  History of Anesthesia Complications Negative for: history of anesthetic complications  Airway Mallampati: II  TM Distance: >3 FB Neck ROM: full    Dental no notable dental hx. (+) Teeth Intact   Pulmonary neg pulmonary ROS,    Pulmonary exam normal breath sounds clear to auscultation       Cardiovascular negative cardio ROS Normal cardiovascular exam Rhythm:regular Rate:Normal     Neuro/Psych  Headaches, negative psych ROS   GI/Hepatic negative GI ROS, Neg liver ROS,   Endo/Other  Morbid obesity  Renal/GU negative Renal ROS  negative genitourinary   Musculoskeletal   Abdominal (+) + obese,   Peds  Hematology negative hematology ROS (+)   Anesthesia Other Findings   Reproductive/Obstetrics (+) Pregnancy                            Anesthesia Physical Anesthesia Plan  ASA: III and emergent  Anesthesia Plan: Epidural   Post-op Pain Management:    Induction:   PONV Risk Score and Plan: 3 and Ondansetron, Dexamethasone, Scopolamine patch - Pre-op and Treatment may vary due to age or medical condition  Airway Management Planned: Natural Airway  Additional Equipment:   Intra-op Plan:   Post-operative Plan:   Informed Consent: I have reviewed the patients History and Physical, chart, labs and discussed the procedure including the risks, benefits and alternatives for the proposed anesthesia with the patient or authorized representative who has indicated his/her understanding and acceptance.   Dental advisory given  Plan Discussed with: CRNA, Anesthesiologist and Surgeon  Anesthesia Plan Comments: (C/Section for failure to progress. Will use Epidural for C/Section. M. Malen GauzeFoster, MD)        Anesthesia Quick Evaluation

## 2017-05-16 NOTE — H&P (Signed)
Jackie Price is a 32 y.o. female presenting for SROM this afternoon, clear fluid.  She denies vaginal bleeding Irregular ctx, notes good fetal movement.  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 Migraine    Past Surgical History:  Procedure Laterality Date  . WISDOM TOOTH EXTRACTION     Family History: family history includes Alzheimer's disease in her maternal grandmother; Cancer in her maternal grandfather; Congestive Heart Failure in her father; Diabetes in her father; Heart disease in her father and maternal grandmother; Hypertension in her maternal grandfather and maternal grandmother; Liver cancer in her maternal grandfather. Social History:  reports that  has never smoked. she has never used smokeless tobacco. She reports that she does not drink alcohol or use drugs.     Maternal Diabetes: No Genetic Screening: Normal Maternal Ultrasounds/Referrals: Abnormal:  Findings:   Cardiac defect Fetal Ultrasounds or other Referrals:  Fetal echo, Referred to Materal Fetal Medicine  Maternal Substance Abuse:  No Significant Maternal Medications:  None Significant Maternal Lab Results:  Lab values include: Group B Strep positive, Other:  Rubella Non-Imm Other Comments:  Per Pediatric Cardiologist [facility] 1. Concern for possible PAPVR (suspect LLPV to a dilated CS) 2. Possible persistent LSVC 3. Very mild and transient fetal bradycardia (HR ~ 110 bpm) 4. Normal PR intervals suggesting normal AV conduction 5. 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 Certainly given the family history, this appears to be a possibility, though father does not seem to have  the typical complete Holt-Oram phenotype so this may represent a varaition or incomplete penetrance. I have communicated this information to Jackie Price and have counseled her accordingly.   The echo cardiogram today does show a dilated RA with some mild TR. There is concern for possible PAPVR which may explain these other findings. The most common cardiac defects in Holt-Oram are usually a secundum or primum ASD and also a VSD. Children with Mickeal SkinnerHolt Oram are also at risk for conduction disease. I do not see evidence of any of those findings at this time, though a secundum ASD is hard to rule out in the setting of a fetal PFO.  I do not believe any of the findings today will put the baby at any significant risk after delivery   ROS as per HPI History Dilation: 2 Effacement (%): 50 Station: -3 Exam by:: katherine g jones RN  Blood pressure (!) 140/91, pulse 82, temperature 98.6 F (37 C), temperature source Oral, resp. rate 20, height 5\' 3"  (1.6 m), weight 125.2 kg (276 lb), last menstrual period 08/07/2016, SpO2 98 %. Exam Physical Exam  Prenatal labs: ABO, Rh: --/--/O POS (01/28 1456) Antibody: NEG (01/28 1456) Rubella: Nonimmune (06/08 0000) RPR: Nonreactive (06/08 0000)  HBsAg: Negative (06/08 0000)  HIV: Non-reactive (06/08 0000)  GBS: Positive (01/04 0000)   Assessment/Plan: 1. PROM at term - Induction with IV pitocin 2. GBS positive - IV penicillin 3. Fetal Cardiac anomalies - baseline bradycardia - 100's-110's - discussed with patient if there is any sign     Of distress on Fetal Heart  tracing would have a low threshold for primary c-section.  4. Elevated Blood pressures - several BPs on labor and Delivery have been mild range - will check preeclamptic labs 5. Continuous monitoring 6. Epidural on demand    [name] 05/16/2017, 5:04 PM

## 2017-05-17 ENCOUNTER — Encounter (HOSPITAL_COMMUNITY): Payer: Self-pay | Admitting: Anesthesiology

## 2017-05-17 ENCOUNTER — Encounter (HOSPITAL_COMMUNITY)
Admission: AD | Disposition: A | Discharge status: Home / Self Care | Payer: Self-pay | Source: Ambulatory Visit | Attending: Obstetrics & Gynecology

## 2017-05-17 LAB — CBC
HCT: 33.1 % — ABNORMAL LOW (ref 36.0–46.0)
Hemoglobin: 11.4 g/dL — ABNORMAL LOW (ref 12.0–15.0)
MCH: 32.5 pg (ref 26.0–34.0)
MCHC: 34.4 g/dL (ref 30.0–36.0)
MCV: 94.3 fL (ref 78.0–100.0)
PLATELETS: 167 10*3/uL (ref 150–400)
RBC: 3.51 MIL/uL — ABNORMAL LOW (ref 3.87–5.11)
RDW: 13.6 % (ref 11.5–15.5)
WBC: 16.7 10*3/uL — ABNORMAL HIGH (ref 4.0–10.5)

## 2017-05-17 LAB — COMPREHENSIVE METABOLIC PANEL
ALK PHOS: 142 U/L — AB (ref 38–126)
ALT: 17 U/L (ref 14–54)
ANION GAP: 11 (ref 5–15)
AST: 24 U/L (ref 15–41)
Albumin: 2.4 g/dL — ABNORMAL LOW (ref 3.5–5.0)
BUN: 8 mg/dL (ref 6–20)
CALCIUM: 8.4 mg/dL — AB (ref 8.9–10.3)
CO2: 19 mmol/L — AB (ref 22–32)
Chloride: 103 mmol/L (ref 101–111)
Creatinine, Ser: 0.59 mg/dL (ref 0.44–1.00)
GFR calc non Af Amer: >60 mL/min (ref >60)
Glucose, Bld: 103 mg/dL — ABNORMAL HIGH (ref 65–99)
Potassium: 4.1 mmol/L (ref 3.5–5.1)
SODIUM: 133 mmol/L — AB (ref 135–145)
Total Bilirubin: 0.5 mg/dL (ref 0.3–1.2)
Total Protein: 5.6 g/dL — ABNORMAL LOW (ref 6.5–8.1)

## 2017-05-17 LAB — PROTEIN / CREATININE RATIO, URINE
Creatinine, Urine: 73 mg/dL
PROTEIN CREATININE RATIO: 0.34 mg/mg{creat} — AB (ref 0.00–0.15)
TOTAL PROTEIN, URINE: 25 mg/dL

## 2017-05-17 LAB — URIC ACID: Uric Acid, Serum: 6.2 mg/dL (ref 2.3–6.6)

## 2017-05-17 LAB — RPR: RPR: NONREACTIVE

## 2017-05-17 SURGERY — Surgical Case
Anesthesia: Epidural

## 2017-05-17 MED ORDER — FENTANYL CITRATE (PF) 100 MCG/2ML IJ SOLN
INTRAMUSCULAR | Status: AC
  Filled 2017-05-17: qty 2

## 2017-05-17 MED ORDER — KETOROLAC TROMETHAMINE 30 MG/ML IJ SOLN
INTRAMUSCULAR | Status: AC
  Filled 2017-05-17: qty 1

## 2017-05-17 MED ORDER — COCONUT OIL OIL
1.0000 "application " | TOPICAL_OIL | Status: DC | PRN
  Administered 2017-05-19: 1 via TOPICAL
  Filled 2017-05-17: qty 120

## 2017-05-17 MED ORDER — ZOLPIDEM TARTRATE 5 MG PO TABS: 5.0000 mg | ORAL_TABLET | Freq: Every evening | ORAL | Status: DC | PRN

## 2017-05-17 MED ORDER — SIMETHICONE 80 MG PO CHEW
80.0000 mg | CHEWABLE_TABLET | ORAL | Status: DC
  Administered 2017-05-18 – 2017-05-19 (×3): 80 mg via ORAL
  Filled 2017-05-17 (×3): qty 1

## 2017-05-17 MED ORDER — FENTANYL CITRATE (PF) 100 MCG/2ML IJ SOLN
25.0000 ug | INTRAMUSCULAR | Status: DC | PRN
  Administered 2017-05-17: 50 ug via INTRAVENOUS
  Administered 2017-05-17: 25 ug via INTRAVENOUS

## 2017-05-17 MED ORDER — BUPIVACAINE HCL (PF) 0.5 % IJ SOLN
INTRAMUSCULAR | Status: AC
  Filled 2017-05-17: qty 30

## 2017-05-17 MED ORDER — OXYTOCIN 40 UNITS IN LACTATED RINGERS INFUSION - SIMPLE MED: 2.5000 [IU]/h | INTRAVENOUS | Status: AC

## 2017-05-17 MED ORDER — IBUPROFEN 600 MG PO TABS
600.0000 mg | ORAL_TABLET | Freq: Four times a day (QID) | ORAL | Status: DC
  Administered 2017-05-17 – 2017-05-20 (×13): 600 mg via ORAL
  Filled 2017-05-17 (×13): qty 1

## 2017-05-17 MED ORDER — SENNOSIDES-DOCUSATE SODIUM 8.6-50 MG PO TABS
2.0000 | ORAL_TABLET | ORAL | Status: DC
  Administered 2017-05-18 (×2): 2 via ORAL
  Filled 2017-05-17 (×3): qty 2

## 2017-05-17 MED ORDER — ACETAMINOPHEN 325 MG PO TABS
650.0000 mg | ORAL_TABLET | ORAL | Status: DC | PRN
  Administered 2017-05-18: 650 mg via ORAL
  Filled 2017-05-17: qty 2

## 2017-05-17 MED ORDER — SCOPOLAMINE 1 MG/3DAYS TD PT72
MEDICATED_PATCH | TRANSDERMAL | Status: DC | PRN
  Administered 2017-05-17: 1 via TRANSDERMAL

## 2017-05-17 MED ORDER — KETOROLAC TROMETHAMINE 30 MG/ML IJ SOLN: 30.0000 mg | Freq: Four times a day (QID) | INTRAMUSCULAR | Status: AC | PRN

## 2017-05-17 MED ORDER — ONDANSETRON HCL 4 MG/2ML IJ SOLN
INTRAMUSCULAR | Status: AC
  Filled 2017-05-17: qty 2

## 2017-05-17 MED ORDER — SODIUM CHLORIDE 0.9 % IR SOLN
Status: DC | PRN
  Administered 2017-05-17: 1000 mL

## 2017-05-17 MED ORDER — NALBUPHINE HCL 10 MG/ML IJ SOLN: 5.0000 mg | INTRAMUSCULAR | Status: DC | PRN

## 2017-05-17 MED ORDER — LIDOCAINE-EPINEPHRINE (PF) 2 %-1:200000 IJ SOLN
INTRAMUSCULAR | Status: DC | PRN
  Administered 2017-05-17: 2 mL
  Administered 2017-05-17 (×2): 3 mL
  Administered 2017-05-17: 2 mL

## 2017-05-17 MED ORDER — LIDOCAINE-EPINEPHRINE (PF) 2 %-1:200000 IJ SOLN
INTRAMUSCULAR | Status: AC
  Filled 2017-05-17: qty 20

## 2017-05-17 MED ORDER — DIPHENHYDRAMINE HCL 25 MG PO CAPS: 25.0000 mg | ORAL_CAPSULE | Freq: Four times a day (QID) | ORAL | Status: DC | PRN

## 2017-05-17 MED ORDER — NALOXONE HCL 4 MG/10ML IJ SOLN: 1.0000 ug/kg/h | INTRAVENOUS | Status: DC | PRN

## 2017-05-17 MED ORDER — DIBUCAINE 1 % RE OINT: 1.0000 "application " | TOPICAL_OINTMENT | RECTAL | Status: DC | PRN

## 2017-05-17 MED ORDER — PRENATAL MULTIVITAMIN CH
1.0000 | ORAL_TABLET | Freq: Every day | ORAL | Status: DC
  Administered 2017-05-17 – 2017-05-20 (×4): 1 via ORAL
  Filled 2017-05-17 (×4): qty 1

## 2017-05-17 MED ORDER — ENOXAPARIN SODIUM 80 MG/0.8ML ~~LOC~~ SOLN
0.5000 mg/kg | SUBCUTANEOUS | Status: DC
  Administered 2017-05-18 – 2017-05-20 (×3): 65 mg via SUBCUTANEOUS
  Filled 2017-05-17 (×4): qty 0.8

## 2017-05-17 MED ORDER — NALBUPHINE HCL 10 MG/ML IJ SOLN: 5.0000 mg | Freq: Once | INTRAMUSCULAR | Status: DC | PRN

## 2017-05-17 MED ORDER — ACETAMINOPHEN 500 MG PO TABS
1000.0000 mg | ORAL_TABLET | Freq: Four times a day (QID) | ORAL | Status: AC
  Administered 2017-05-17 – 2017-05-18 (×4): 1000 mg via ORAL
  Filled 2017-05-17 (×4): qty 2

## 2017-05-17 MED ORDER — MEPERIDINE HCL 25 MG/ML IJ SOLN: 6.2500 mg | INTRAMUSCULAR | Status: DC | PRN

## 2017-05-17 MED ORDER — SIMETHICONE 80 MG PO CHEW
80.0000 mg | CHEWABLE_TABLET | Freq: Three times a day (TID) | ORAL | Status: DC
  Administered 2017-05-17 – 2017-05-20 (×10): 80 mg via ORAL
  Filled 2017-05-17 (×10): qty 1

## 2017-05-17 MED ORDER — LACTATED RINGERS IV SOLN: INTRAVENOUS | Status: DC

## 2017-05-17 MED ORDER — MORPHINE SULFATE (PF) 0.5 MG/ML IJ SOLN
INTRAMUSCULAR | Status: DC | PRN
  Administered 2017-05-17: 1 mg via INTRAVENOUS
  Administered 2017-05-17: 3 mg via EPIDURAL
  Administered 2017-05-17: 1 mg via INTRAVENOUS

## 2017-05-17 MED ORDER — WITCH HAZEL-GLYCERIN EX PADS: 1.0000 "application " | MEDICATED_PAD | CUTANEOUS | Status: DC | PRN

## 2017-05-17 MED ORDER — LACTATED RINGERS IV SOLN
INTRAVENOUS | Status: DC | PRN
  Administered 2017-05-17: 08:00:00 via INTRAVENOUS

## 2017-05-17 MED ORDER — METOCLOPRAMIDE HCL 5 MG/ML IJ SOLN
10.0000 mg | Freq: Once | INTRAMUSCULAR | Status: AC | PRN
  Administered 2017-05-17 (×2): 5 mg via INTRAVENOUS

## 2017-05-17 MED ORDER — LACTATED RINGERS IV SOLN
INTRAVENOUS | Status: DC
  Administered 2017-05-17: 18:00:00 via INTRAVENOUS

## 2017-05-17 MED ORDER — DIPHENHYDRAMINE HCL 25 MG PO CAPS
25.0000 mg | ORAL_CAPSULE | ORAL | Status: DC | PRN
  Filled 2017-05-17: qty 1

## 2017-05-17 MED ORDER — DEXTROSE 5 % IV SOLN
INTRAVENOUS | Status: DC | PRN
  Administered 2017-05-17: 3 g via INTRAVENOUS

## 2017-05-17 MED ORDER — OXYTOCIN 10 UNIT/ML IJ SOLN
INTRAMUSCULAR | Status: AC
  Filled 2017-05-17: qty 4

## 2017-05-17 MED ORDER — SIMETHICONE 80 MG PO CHEW
80.0000 mg | CHEWABLE_TABLET | ORAL | Status: DC | PRN
  Filled 2017-05-17: qty 1

## 2017-05-17 MED ORDER — KETOROLAC TROMETHAMINE 30 MG/ML IJ SOLN
30.0000 mg | Freq: Four times a day (QID) | INTRAMUSCULAR | Status: AC | PRN
  Administered 2017-05-17: 30 mg via INTRAMUSCULAR

## 2017-05-17 MED ORDER — LACTATED RINGERS IV SOLN
INTRAVENOUS | Status: DC
  Administered 2017-05-17: 04:00:00 via INTRAUTERINE

## 2017-05-17 MED ORDER — BUPIVACAINE HCL (PF) 0.5 % IJ SOLN
INTRAMUSCULAR | Status: DC | PRN
  Administered 2017-05-17: 30 mL

## 2017-05-17 MED ORDER — SODIUM CHLORIDE 0.9% FLUSH: 3.0000 mL | INTRAVENOUS | Status: DC | PRN

## 2017-05-17 MED ORDER — FENTANYL CITRATE (PF) 100 MCG/2ML IJ SOLN
INTRAMUSCULAR | Status: DC | PRN
  Administered 2017-05-17 (×2): 50 ug via INTRAVENOUS

## 2017-05-17 MED ORDER — ENOXAPARIN SODIUM 40 MG/0.4ML ~~LOC~~ SOLN: 40.0000 mg | SUBCUTANEOUS | Status: DC

## 2017-05-17 MED ORDER — SODIUM BICARBONATE 8.4 % IV SOLN
INTRAVENOUS | Status: AC
Start: 2017-05-17 — End: 2017-05-17
  Filled 2017-05-17: qty 50

## 2017-05-17 MED ORDER — LIDOCAINE HCL 1 % IJ SOLN
INTRAMUSCULAR | Status: AC
  Filled 2017-05-17: qty 20

## 2017-05-17 MED ORDER — MORPHINE SULFATE (PF) 0.5 MG/ML IJ SOLN
INTRAMUSCULAR | Status: AC
  Filled 2017-05-17: qty 10

## 2017-05-17 MED ORDER — MENTHOL 3 MG MT LOZG: 1.0000 | LOZENGE | OROMUCOSAL | Status: DC | PRN

## 2017-05-17 MED ORDER — METOCLOPRAMIDE HCL 5 MG/ML IJ SOLN
INTRAMUSCULAR | Status: AC
  Filled 2017-05-17: qty 2

## 2017-05-17 MED ORDER — NALOXONE HCL 0.4 MG/ML IJ SOLN: 0.4000 mg | INTRAMUSCULAR | Status: DC | PRN

## 2017-05-17 MED ORDER — LACTATED RINGERS IV SOLN
INTRAVENOUS | Status: DC | PRN
  Administered 2017-05-17 (×3): via INTRAVENOUS

## 2017-05-17 MED ORDER — DIPHENHYDRAMINE HCL 50 MG/ML IJ SOLN: 12.5000 mg | INTRAMUSCULAR | Status: DC | PRN

## 2017-05-17 MED ORDER — LACTATED RINGERS IV SOLN
INTRAVENOUS | Status: DC | PRN
  Administered 2017-05-17: 40 [IU] via INTRAVENOUS

## 2017-05-17 MED ORDER — SCOPOLAMINE 1 MG/3DAYS TD PT72: 1.0000 | MEDICATED_PATCH | Freq: Once | TRANSDERMAL | Status: DC

## 2017-05-17 MED ORDER — ONDANSETRON HCL 4 MG/2ML IJ SOLN: 4.0000 mg | Freq: Three times a day (TID) | INTRAMUSCULAR | Status: DC | PRN

## 2017-05-17 MED ORDER — SODIUM BICARBONATE 8.4 % IV SOLN
INTRAVENOUS | Status: DC | PRN
  Administered 2017-05-17: 5 mL via EPIDURAL
  Administered 2017-05-17: 3 mL via EPIDURAL
  Administered 2017-05-17 (×2): 5 mL via EPIDURAL

## 2017-05-17 SURGICAL SUPPLY — 45 items
BENZOIN TINCTURE PRP APPL 2/3 (GAUZE/BANDAGES/DRESSINGS) ×2 IMPLANT
CHLORAPREP W/TINT 26ML (MISCELLANEOUS) ×2 IMPLANT
CLAMP CORD UMBIL (MISCELLANEOUS) IMPLANT
CLOSURE STERI-STRIP 1/4X4 (GAUZE/BANDAGES/DRESSINGS) ×2 IMPLANT
CLOTH BEACON ORANGE TIMEOUT ST (SAFETY) ×2 IMPLANT
DECANTER SPIKE VIAL GLASS SM (MISCELLANEOUS) ×2 IMPLANT
DERMABOND ADHESIVE PROPEN (GAUZE/BANDAGES/DRESSINGS) ×1
DERMABOND ADVANCED (GAUZE/BANDAGES/DRESSINGS)
DERMABOND ADVANCED .7 DNX12 (GAUZE/BANDAGES/DRESSINGS) IMPLANT
DERMABOND ADVANCED .7 DNX6 (GAUZE/BANDAGES/DRESSINGS) ×1 IMPLANT
DEVICE BLD TRNS LUER ATTCH (MISCELLANEOUS) ×2 IMPLANT
DRAPE C SECTION CLR SCREEN (DRAPES) ×2 IMPLANT
DRSG OPSITE POSTOP 4X10 (GAUZE/BANDAGES/DRESSINGS) ×2 IMPLANT
ELECT REM PT RETURN 9FT ADLT (ELECTROSURGICAL) ×2
ELECTRODE REM PT RTRN 9FT ADLT (ELECTROSURGICAL) ×1 IMPLANT
EXTRACTOR VACUUM M CUP 4 TUBE (SUCTIONS) IMPLANT
GLOVE BIOGEL PI IND STRL 7.0 (GLOVE) ×2 IMPLANT
GLOVE BIOGEL PI INDICATOR 7.0 (GLOVE) ×2
GLOVE SURG SS PI 6.5 STRL IVOR (GLOVE) ×2 IMPLANT
GOWN STRL REUS W/TWL LRG LVL3 (GOWN DISPOSABLE) ×4 IMPLANT
HOVERMATT SINGLE USE (MISCELLANEOUS) ×2 IMPLANT
KIT ABG SYR 3ML LUER SLIP (SYRINGE) IMPLANT
NEEDLE HYPO 22GX1.5 SAFETY (NEEDLE) ×2 IMPLANT
NEEDLE HYPO 25X5/8 SAFETYGLIDE (NEEDLE) IMPLANT
NS IRRIG 1000ML POUR BTL (IV SOLUTION) ×2 IMPLANT
PACK C SECTION WH (CUSTOM PROCEDURE TRAY) ×2 IMPLANT
PAD ABD 7.5X8 STRL (GAUZE/BANDAGES/DRESSINGS) ×2 IMPLANT
PAD OB MATERNITY 4.3X12.25 (PERSONAL CARE ITEMS) ×2 IMPLANT
PENCIL SMOKE EVAC W/HOLSTER (ELECTROSURGICAL) ×2 IMPLANT
RTRCTR C-SECT PINK 25CM LRG (MISCELLANEOUS) ×2 IMPLANT
SPONGE GAUZE 4X4 12PLY STER LF (GAUZE/BANDAGES/DRESSINGS) ×4 IMPLANT
SUT CHROMIC 1 CTX 36 (SUTURE) IMPLANT
SUT CHROMIC 2 0 CT 1 (SUTURE) ×2 IMPLANT
SUT MON AB 4-0 PS1 27 (SUTURE) ×2 IMPLANT
SUT PLAIN 1 NONE 54 (SUTURE) IMPLANT
SUT PLAIN 2 0 (SUTURE)
SUT PLAIN 2 0 XLH (SUTURE) IMPLANT
SUT PLAIN ABS 2-0 CT1 27XMFL (SUTURE) IMPLANT
SUT VIC AB 0 CTX 36 (SUTURE) ×1
SUT VIC AB 0 CTX36XBRD ANBCTRL (SUTURE) ×1 IMPLANT
SUT VIC AB 1 CTX 36 (SUTURE) ×2
SUT VIC AB 1 CTX36XBRD ANBCTRL (SUTURE) ×2 IMPLANT
SYR CONTROL 10ML LL (SYRINGE) ×2 IMPLANT
TOWEL OR 17X24 6PK STRL BLUE (TOWEL DISPOSABLE) ×2 IMPLANT
TRAY FOLEY BAG SILVER LF 14FR (SET/KITS/TRAYS/PACK) ×2 IMPLANT

## 2017-05-17 NOTE — Transfer of Care (Signed)
Immediate Anesthesia Transfer of Care Note  Patient: Jackie Price  Procedure(s) Performed: CESAREAN SECTION (N/A )  Patient Location: PACU  Anesthesia Type:Epidural  Level of Consciousness: awake  Airway & Oxygen Therapy: Patient Spontanous Breathing  Post-op Assessment: Report given to RN and Post -op Vital signs reviewed and stable  Post vital signs: stable  Last Vitals:  Vitals:   05/17/17 0402 05/17/17 0832  BP: 119/62 133/68  Pulse: 99   Resp:    Temp:    SpO2:      Last Pain:  Vitals:   05/17/17 0400  TempSrc:   PainSc: 3       Patients Stated Pain Goal: 5 (05/17/17 0400)  Complications: No apparent anesthesia complications

## 2017-05-17 NOTE — Brief Op Note (Addendum)
05/16/2017 - 05/17/2017  8:24 AM  PATIENT:  Jackie Price  32 y.o. female  PRE-OPERATIVE DIAGNOSIS:  39  week 3 day EGA intrauterine pregnancy 2. Abnormal fetal heart tracing with recurrent late decelerations and minimal- absent variability  3. Remote from delivery at 7 cm dilation. 4.  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 5.  Morbid obesity with BMI 49.   POST-OPERATIVE DIAGNOSIS:  39  week 3 day EGA intrauterine pregnancy 2. Abnormal fetal heart tracing with recurrent late decelerations and minimal- absent variability  3. Remote from delivery at 7 cm dilation. 4.  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 5.  Morbid obesity with BMI 49.  PROCEDURE:  Procedure(s): CESAREAN SECTION (N/A)  SURGEON:  Surgeon(s) and Role:    * Hoover BrownsKulwa, Guy Toney, MD - Primary  ASSISTANTS: RNFA, French Anaracy.    ANESTHESIA:   Epidural  EBL:  961 mL   BLOOD ADMINISTERED:none  DRAINS: none   LOCAL MEDICATIONS USED:  MARCAINE     SPECIMEN:  Source of Specimen:  Placenta  DISPOSITION OF SPECIMEN:  PATHOLOGY  COUNTS:  YES  TOURNIQUET:  * No tourniquets in log *  DICTATION: .Note written in EPIC  PLAN OF CARE: Admit to inpatient   PATIENT DISPOSITION:  PACU - hemodynamically stable.   Delay start of Pharmacological VTE agent (>24hrs) due to surgical blood loss or risk of bleeding: not applicable

## 2017-05-17 NOTE — Lactation Note (Addendum)
This note was copied from a baby's chart. Lactation Consultation Note  Patient Name: Jackie Price ZOXWR'UToday's Date: 05/17/2017 Reason for consult: Initial assessment;Term   Initial assessment with first time mom of 9 hour old infant. Infant with 1 BF for 40 minutes and 1 stool since birth. LATCH score 7.   Mom and RN report infant has been sleepy since birth. Attempted to awaken infant to latch to the breast. He took nipple in mouth and suckled once or twice and came off. Spoon fed infant 1 cc colostrum that was easily expressible, infant tolerated it well. Infant gaggy at times with feeding attempt. Infant left STS with mom and fell asleep.   Enc mom to keep infant STS as much as possible. Enc mom to offer breast at first feeding cues. Enc mom to hand express and spoon or finger feed every few hours as able. Enc mom to call out for feeding assistance as needed. Mom is able to hand express colostrum. Mom was given spoons and colostrum collection containers. Breast milk storage at room temperature reviewed.   Mom with large compressible breasts and areola and short shaft everted nipples. Colostrum easily expressible.   BF basics, positioning, hand expression, NB nutritional needs, NB feeding behaviors, awakening techniques, spoon feeding, colostrum and milk coming to volume reviewed with mom.   BF Resources handout and LC Brochure given, mom informed of IP/OP Services, BF Support Groups and LC phone #.   Mom's questions re BF were answered. Mom is checking with insurance about a pump for home. Follow up tomorrow and prn.    Maternal Data Formula Feeding for Exclusion: No Has patient been taught Hand Expression?: Yes Does the patient have breastfeeding experience prior to this delivery?: No  Feeding Feeding Type: Breast Fed  LATCH Score Latch: Too sleepy or reluctant, no latch achieved, no sucking elicited.  Audible Swallowing: None  Type of Nipple: Everted at rest and after  stimulation  Comfort (Breast/Nipple): Soft / non-tender  Hold (Positioning): Assistance needed to correctly position infant at breast and maintain latch.  LATCH Score: 5  Interventions Interventions: Breast feeding basics reviewed;Support pillows;Assisted with latch;Position options;Skin to skin;Expressed milk;Breast massage;Hand express;Breast compression  Lactation Tools Discussed/Used WIC Program: No   Consult Status Consult Status: Follow-up Date: 05/18/17 Follow-up type: In-patient    Jackie Price 05/17/2017, 5:03 PM

## 2017-05-17 NOTE — Anesthesia Postprocedure Evaluation (Signed)
Anesthesia Post Note  Patient: Jackie LifeLauren Price  Procedure(s) Performed: CESAREAN SECTION (N/A )     Patient location during evaluation: Mother Baby Anesthesia Type: Epidural Level of consciousness: awake Pain management: pain level controlled Vital Signs Assessment: post-procedure vital signs reviewed and stable Respiratory status: spontaneous breathing Cardiovascular status: stable Postop Assessment: no headache, no backache, epidural receding, patient able to bend at knees, no apparent nausea or vomiting and adequate PO intake Anesthetic complications: no    Last Vitals:  Vitals:   05/17/17 1215 05/17/17 1315  BP:    Pulse:    Resp:    Temp:    SpO2: 94% 94%    Last Pain:  Vitals:   05/17/17 1315  TempSrc:   PainSc: 0-No pain   Pain Goal: Patients Stated Pain Goal: 4 (05/17/17 0951)               Fanny DanceMULLINS,Mallarie Voorhies

## 2017-05-17 NOTE — Progress Notes (Signed)
Jackie LifeLauren Price MRN: [id number]  Subjective: -Nurse call reports minimal variability s/p pitocin discontinuation and initiation of amnioinfusion. Strip reviewed.    Objective: BP 119/62   Pulse 99   Temp (!) 97.5 F (36.4 C) (Oral)   Resp 16   Ht 5\' 3"  (1.6 m)   Wt 125.2 kg (276 lb)   LMP 08/07/2016   SpO2 98%   BMI 48.89 kg/m  No intake/output data recorded. No intake/output data recorded.  Fetal Monitoring: FHT: 145 bpm, Min Var, -Decels, -Accels UC: Q2-673min    Vaginal Exam: SVE:   Dilation: 5.5 Effacement (%): 90 Station: -1 Exam by:: Gerrit HeckJessica Ismelda Weatherman, CNM Membranes:SROM x 29hrs Internal Monitors: None  Augmentation/Induction: Pitocin:Restarted Cytotec: None  Assessment:  IUP at 39.3wks Cat II FT  Prolonged ROM Labor Augmentation  Plan: -Dr. Carmela HurtE. Kulwa consulted and advised: *VE to assess scalp stimulation response *If positive restart pitocin and monitor *Discontinue at any signs of distress -In room to discuss POC with patient who is agreeable -Pitocin restarted at 471mUn/min -Nurse instructed to wean from O2 -Continue other mgmt as ordered  Valma CavaJessica L Meha Vidrine,MSN, CNM 05/17/2017, 5:02 AM

## 2017-05-17 NOTE — Op Note (Addendum)
Patient:  Jackie Price, Jackie Price DOB: Oct 04, 1985     MRN:   [id number]     DATE OF SURGERY: 05/17/2017    PREOP DIAGNOSIS:  1. 39  week 3 day EGA intrauterine pregnancy 2. Abnormal fetal heart tracing with recurrent late decelerations and minimal- absent variability  3. Remote from delivery at 7 cm dilation. 4.  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 5.  Morbid obesity with BMI 49.  POSTOP DIAGNOSIS: Same as above.  PROCEDURE: Primary low uterine segment transverse cesarean section via Pfannenstiel incision.     SURGEON: Dr.  Angus PalmsEMA Sallye OberKULWA  ASSISTANT: French Anaracy, RNFA  ANESTHESIA: Epidural   COMPLICATIONS: None  FINDINGS: Viable female infant in cephalic presentation, nuchal cord that was easily released, ROP position, weight pending, Apgar scores of 9 and 9. Normal uterus and fallopian tubes and ovaries bilaterally.    EBL:  961 cc  IV FLUID:  2400 cc LR   URINE OUTPUT: 75 cc concentrated urine  INDICATIONS:31 y/o P0 who presented with spontaneous rupture of membranes.  She received pitocin augmentation and got to 7 cm.  Baby showed episodes of recurrent late decelerations with minimal- absent variability that reoccurred despite intrauterine resuscitation.  As she was remote from delivery at 7 cm with abnormal fetal heart tracing it was decided to proceed with a primary cesarean section.  She was consented for the procedure after explaining risks benefits and alternatives of the procedure including risks of bleeding, infection and damage to organs.    PROCEDURE:   She was taken to the operating room where epidural anesthesia was administered without difficulty.  Preoperative antibiotic of 3 g ancef was administered.  She was placed in the supine position with foley catheter and her abdomen and vagina were prepped in the usual sterile fashion.  A Pfannenstiel incision was made with the scalpel and the incision extended through the subcutaneous layer and also the fascia with  the bovie.  The fascia was nicked in the midline and then was further separated from the rectus muscles bilaterally using Mayo scissors. Kochers were placed inferiorly and then superiorly to allow further separation of fascia from the rectus muscles.  The peritoneal cavity was entered bluntly with the fingers. The Alexis retractor was placed in. The bladder flap was created using Metzenbaum scissors.   The uterus was incised with a scalpel and the incision extended bluntly bilaterally with fingers. Clear amniotic fluid was noted.  The head then the rest of the body was then delivered with abdominal pressure atraumatically.  She delivered a viable female infant.  Cord blood was collected.    The uterus was not  exteriorized.  The placenta was delivered with gentle traction on the umbilical cord. The edges of the uterus was grasped with Allis clamps and also T. Clamps. The uterus was cleared of clots and debris with a lap.  The uterine incision was closed with #1 Vicryl in a running locked stitch. An imbricating layer of the same stitch was placed over the initial closure.  Irrigation was applied and suctioned out. Excellent hemostasis was noted over the uterine incision.  The muscles and peritoneum were then reapproximated using chromic suture in interrupted stitches.  Fascia was closed using 0 Vicryl in a running stitch. The subcutaneous layer was irrigated and suctioned out. Small perforators were contained with the bovie.  The subcutaneous was closed over using 1-0 plain in interrupted stitches. The skin was closed using 4-0 Monocryl.  Benzocaine and steri-strips were applied.  Dermabond was applied. Honeycomb was then applied. The patient was then cleaned and she was taken to the recovery room in stable condition. The neonate was also taken to the nursery in stable condition.   SPECIMEN: Placenta to pathology.   DISPOSITION: TO PACU, STABLE.   Dr. Sallye Ober.

## 2017-05-17 NOTE — Addendum Note (Signed)
Addendum  created 05/17/17 2050 by Renford DillsMullins, Zyria Fiscus L, CRNA   Sign clinical note

## 2017-05-17 NOTE — Anesthesia Postprocedure Evaluation (Signed)
Anesthesia Post Note  Patient: Jackie Price  Procedure(s) Performed: CESAREAN SECTION (N/A )     Patient location during evaluation: Mother Baby Anesthesia Type: Epidural Level of consciousness: awake Pain management: pain level controlled Vital Signs Assessment: post-procedure vital signs reviewed and stable Respiratory status: spontaneous breathing Cardiovascular status: stable Postop Assessment: no headache, no backache, epidural receding, patient able to bend at knees, no apparent nausea or vomiting and adequate PO intake Anesthetic complications: no    Last Vitals:  Vitals:   05/17/17 1830 05/17/17 1908  BP:    Pulse:    Resp:    Temp:    SpO2: 94% 95%    Last Pain:  Vitals:   05/17/17 1730  TempSrc:   PainSc: 0-No pain   Pain Goal: Patients Stated Pain Goal: 4 (05/17/17 0951)               Fanny DanceMULLINS,Rucha Wissinger

## 2017-05-17 NOTE — Progress Notes (Signed)
Jackie LifeLauren Altman is a 32 y.o. G1P0000 at 139w3d who presented with SROm and received pitocin augmentation  Subjective: Patient feels vaginal pressure  Objective: BP 119/62   Pulse 99   Temp (!) 97.5 F (36.4 C) (Oral)   Resp 16   Ht 5\' 3"  (1.6 m)   Wt 125.2 kg (276 lb)   LMP 08/07/2016   SpO2 98%   BMI 48.89 kg/m  No intake/output data recorded. No intake/output data recorded.  FHT:  FHR: 140 bpm, variability: moderate,  accelerations:  Present,  decelerations:  Absent  Accelerations present with scalp stimulation.  UC:   regular, every 2- 3 minutes SVE:   Dilation: 7 Effacement (%): 90 Station: -1 Exam by:: Dr.Tyvion Edmondson  Labs: Lab Results  Component Value Date   WBC 11.9 (H) 05/16/2017   HGB 13.3 05/16/2017   HCT 38.2 05/16/2017   MCV 93.9 05/16/2017   PLT 189 05/16/2017    Assessment / Plan: Labor augmentation with pitocin, with resolved abnormal tracing  Labor: Defer cesarean delivery.  Watch EFM for now.  If abnormal tracing reoccurs then will proceed with cesarean delivery.  Patient agreeable with plan.   Fetal Wellbeing:  Category I Pain Control:  Epidural I/D:  GBS pos on penicillin.  Anticipated MOD:  Unclear, if abnormal tracing resumes will do C/S, if tracing remains normal then NSVD.  Konrad FelixKULWA,Mattalyn Anderegg WAKURU, MD. 05/17/2017, 6:23 AM

## 2017-05-17 NOTE — Addendum Note (Signed)
Addendum  created 05/17/17 1352 by Renford DillsMullins, Analeese Andreatta L, CRNA   Sign clinical note

## 2017-05-17 NOTE — Anesthesia Postprocedure Evaluation (Signed)
Anesthesia Post Note  Patient: Jackie Price  Procedure(s) Performed: CESAREAN SECTION (N/A )     Patient location during evaluation: PACU Anesthesia Type: Epidural Level of consciousness: awake and alert and oriented Pain management: pain level controlled Vital Signs Assessment: post-procedure vital signs reviewed and stable Respiratory status: nonlabored ventilation, respiratory function stable and spontaneous breathing Cardiovascular status: blood pressure returned to baseline and stable Postop Assessment: patient able to bend at knees, epidural receding, no backache, no headache and no apparent nausea or vomiting Anesthetic complications: no    Last Vitals:  Vitals:   05/17/17 1003 05/17/17 1115  BP: (!) 149/83 131/65  Pulse: 95 72  Resp: 18 18  Temp: 37 C 37 C  SpO2: 98% 91%    Last Pain:  Vitals:   05/17/17 1115  TempSrc: Oral  PainSc: 0-No pain   Pain Goal: Patients Stated Pain Goal: 4 (05/17/17 0951)               Ronith Berti A.

## 2017-05-18 LAB — COMPREHENSIVE METABOLIC PANEL
ALBUMIN: 2.2 g/dL — AB (ref 3.5–5.0)
ALT: 15 U/L (ref 14–54)
AST: 21 U/L (ref 15–41)
Alkaline Phosphatase: 118 U/L (ref 38–126)
Anion gap: 9 (ref 5–15)
BILIRUBIN TOTAL: 0.2 mg/dL — AB (ref 0.3–1.2)
BUN: 7 mg/dL (ref 6–20)
CO2: 20 mmol/L — ABNORMAL LOW (ref 22–32)
Calcium: 8.2 mg/dL — ABNORMAL LOW (ref 8.9–10.3)
Chloride: 110 mmol/L (ref 101–111)
Creatinine, Ser: 0.58 mg/dL (ref 0.44–1.00)
GFR calc Af Amer: >60 mL/min (ref >60)
GLUCOSE: 102 mg/dL — AB (ref 65–99)
POTASSIUM: 3.7 mmol/L (ref 3.5–5.1)
Sodium: 139 mmol/L (ref 135–145)
TOTAL PROTEIN: 5.4 g/dL — AB (ref 6.5–8.1)

## 2017-05-18 LAB — CBC
HEMATOCRIT: 30.1 % — AB (ref 36.0–46.0)
Hemoglobin: 10.4 g/dL — ABNORMAL LOW (ref 12.0–15.0)
MCH: 32.8 pg (ref 26.0–34.0)
MCHC: 34.6 g/dL (ref 30.0–36.0)
MCV: 95 fL (ref 78.0–100.0)
Platelets: 135 10*3/uL — ABNORMAL LOW (ref 150–400)
RBC: 3.17 MIL/uL — ABNORMAL LOW (ref 3.87–5.11)
RDW: 14.1 % (ref 11.5–15.5)
WBC: 12.2 10*3/uL — ABNORMAL HIGH (ref 4.0–10.5)

## 2017-05-18 NOTE — Progress Notes (Signed)
Jackie Price [id number]  Subjective: Postpartum Day 1: Primary C/S due to fetal intolerance to labor Patient up ad lib, reports no syncope or dizziness.  Foley removed this am, no void yet. 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 Also having wrist Xray due to ? Deformity, clinodactly bilaterally on 4th fingers. Rooming in with mom.  Objective: Temp:  [97.5 F (36.4 C)-99 F (37.2 C)] 97.5 F (36.4 C) (01/30 0453) Pulse Rate:  [62-109] 78 (01/30 0453) Resp:  [11-23] 18 (01/30 0453) BP: (103-158)/(53-90) 119/67 (01/30 0453) SpO2:  [91 %-100 %] 97 % (01/30 0453)   Vitals:   05/17/17 2051 05/18/17 0025 05/18/17 0212 05/18/17 0453  BP: 110/62 (!) 103/53  119/67  Pulse: 81 68  78  Resp: 18 18  18   Temp: 99 F (37.2 C) 98.4 F (36.9 C)  (!) 97.5 F (36.4 C)  TempSrc: Oral Oral  Oral  SpO2: 95% 95% 94% 97%  Weight:      Height:        CBC Latest Ref Rng & Units 05/18/2017 05/17/2017 05/16/2017  WBC 4.0 - 10.5 K/uL 12.2(H) 16.7(H) 11.9(H)  Hemoglobin 12.0 - 15.0 g/dL 10.4(L) 11.4(L) 13.3  Hematocrit 36.0 - 46.0 % 30.1(L) 33.1(L) 38.2  Platelets 150 - 400 K/uL 135(L) 167 189     Physical Exam:  General: alert Lochia: appropriate Uterine Fundus: firm Abdomen:  + bowel sounds Incision: Dressing CDI DVT Evaluation: No evidence of DVT seen on physical exam. Negative Homan's sign.   Assessment/Plan: Status post cesarean delivery, day 1. Stable Continue current care. Patient plans circumcision--planning to call insurance today to check on inpatient or outpatient plan.   Nigel BridgemanVicki Treyana Sturgell MSN, CNM 05/18/2017, 7:40 AM

## 2017-05-18 NOTE — Progress Notes (Signed)
Called MD for orders to change pt honeycomb dressing because dressing is saturated. Verbal order received and dressing changed.

## 2017-05-18 NOTE — Lactation Note (Signed)
This note was copied from a baby's chart. Lactation Consultation Note  Patient Name: Boy Emmaline LifeLauren Nunziato Today's Date: 05/18/2017  Mom states baby is doing better at breast.  Baby is currently sleeping in mom's arms.  Skin to skin encouraged and watching for feeding cues.  Encouraged to call for assist/concerns prn.   Maternal Data    Feeding    LATCH Score                   Interventions    Lactation Tools Discussed/Used     Consult Status      Huston FoleyMOULDEN, Faiz Weber S 05/18/2017, 1:48 PM

## 2017-05-18 NOTE — Lactation Note (Signed)
This note was copied from a baby's chart. Lactation Consultation Note  Patient Name: Jackie Price LifeLauren Vanorder [id number]'XToday's Date: 05/18/2017 Reason for consult: Follow-up assessment   Employee pump given. Mom to call out for feeding assistance as needed.    Maternal Data    Feeding    LATCH Score                   Interventions    Lactation Tools Discussed/Used     Consult Status Consult Status: Follow-up Date: 05/19/17 Follow-up type: In-patient    Silas FloodSharon S Elroy Schembri 05/18/2017, 3:56 PM

## 2017-05-19 NOTE — Lactation Note (Signed)
This note was copied from a baby's chart. Lactation Consultation Note  Patient Name: Jackie Price ZOXWR'UToday's Date: 05/19/2017 Reason for consult: Follow-up assessment;Infant weight loss;Term  5156 hours old female who is being exclusively BF by his mother. Per mom feedings at the breast are comfortable, baby was asleep when entering the room, unable to assess latch but instructed mom to call for assistance when she's ready to latch baby. BF basics were reviewed, Stressed the importance of adding calories into baby's diet at 56 hours of age, encouraged mom to keep hand expressing and spoon feed baby any amount of breastmilk she's able to get. Mom has a DEBP she's a [employer] employee. She's aware of LC services and will call us if she still needs assistance.  Interventions Interventions: Breast feeding basics reviewed  Lactation Tools Discussed/Used     Consult Status Consult Status: Follow-up Date: 05/20/17 Follow-up type: In-patient    Jackie Price Jackie Price 05/19/2017, 4:28 PM

## 2017-05-19 NOTE — Progress Notes (Addendum)
Emmaline LifeLauren Griffeth [id number]  Subjective: Postpartum Day 2: Primary C/S due to fetal intolerance to labor. Patient up ad lib, reports no syncope or dizziness. Feeding:  Breast Contraceptive plan:  NFP  Baby doing well.  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  Objective: Temp:  [97.4 F (36.3 C)] 97.4 F (36.3 C) (01/31 0533) Pulse Rate:  [66-72] 66 (01/31 0533) Resp:  [18] 18 (01/31 0533) BP: (112-120)/(73-80) 120/73 (01/31 0533)  CBC Latest Ref Rng & Units 05/18/2017 05/17/2017 05/16/2017  WBC 4.0 - 10.5 K/uL 12.2(H) 16.7(H) 11.9(H)  Hemoglobin 12.0 - 15.0 g/dL 10.4(L) 11.4(L) 13.3  Hematocrit 36.0 - 46.0 % 30.1(L) 33.1(L) 38.2  Platelets 150 - 400 K/uL 135(L) 167 189     Physical Exam:  General: alert Lochia: appropriate Uterine Fundus: firm Abdomen:  + bowel sounds Incision: Honeycomb dressing CDI--dressing was replaced yesterday due to soiling, new dressing without any staining. DVT Evaluation: No evidence of DVT seen on physical exam. Negative Homan's sign.   Assessment/Plan: Status post cesarean delivery, day 2 Stable Continue current care. Plan for discharge tomorrow    Nigel BridgemanVicki Latham MSN, CNM 05/19/2017, 7:39 AM   I saw and examined patient at bedside and agree with above findings, assessment and plan.  Dr. Sallye OberKulwa.

## 2017-05-20 MED ORDER — IBUPROFEN 600 MG PO TABS: 600.0000 mg | ORAL_TABLET | Freq: Four times a day (QID) | ORAL | 0 refills | Status: AC

## 2017-05-20 NOTE — Discharge Summary (Signed)
OB Discharge Summary     Patient Name: Jackie Price DOB: 1985/12/12 MRN: [id number]  Date of admission: 05/16/2017 Delivering MD: Hoover Browns   Date of discharge: 05/20/2017  Admitting diagnosis: 39WKS,WATER BROKE Intrauterine pregnancy: [redacted]w[redacted]d     Secondary diagnosis:  Active Problems:   Pregnancy   Delivery of pregnancy by cesarean section  Additional problems: baby cardiac anomaly     Discharge diagnosis: Term Pregnancy Delivered                                                                                                Post partum procedures:  Augmentation: Pitocin  Complications: None  Hospital course:  Onset of Labor With Unplanned C/S  32 y.o. yo G1P0000 at [redacted]w[redacted]d was admitted in Active Labor on 05/16/2017. Patient had a labor course significant for fetal intolerance to labor. Membrane Rupture Time/Date: 12:01 AM ,05/16/2017   The patient went for cesarean section due to Non-Reassuring FHR, and delivered a Viable infant,05/17/2017  Details of operation can be found in separate operative note. Patient had an uncomplicated postpartum course.  She is ambulating,tolerating a regular diet, passing flatus, and urinating well.  Patient is discharged home in stable condition 05/21/17.  Physical exam  Vitals:   05/18/17 1836 05/19/17 0533 05/19/17 1750 05/20/17 0526  BP: 112/80 120/73 (!) 146/87 (!) 146/88  Pulse: 72 66 79 78  Resp: 18 18 18 19   Temp:  (!) 97.4 F (36.3 C) 98.5 F (36.9 C) 97.8 F (36.6 C)  TempSrc: Oral Oral Oral Oral  SpO2:    100%  Weight:      Height:       General: alert, cooperative and no distress Lochia: appropriate Uterine Fundus: firm Incision: Dressing is clean, dry, and intact DVT Evaluation: No evidence of DVT seen on physical exam. Labs: Lab Results  Component Value Date   WBC 12.2 (H) 05/18/2017   HGB 10.4 (L) 05/18/2017   HCT 30.1 (L) 05/18/2017   MCV 95.0 05/18/2017   PLT 135 (L) 05/18/2017   CMP Latest Ref Rng & Units  05/18/2017  Glucose 65 - 99 mg/dL 782(N)  BUN 6 - 20 mg/dL 7  Creatinine 5.62 - 1.30 mg/dL 8.65  Sodium 784 - 696 mmol/L 139  Potassium 3.5 - 5.1 mmol/L 3.7  Chloride 101 - 111 mmol/L 110  CO2 22 - 32 mmol/L 20(L)  Calcium 8.9 - 10.3 mg/dL 8.2(L)  Total Protein 6.5 - 8.1 g/dL 2.9(B)  Total Bilirubin 0.3 - 1.2 mg/dL 2.8(U)  Alkaline Phos 38 - 126 U/L 118  AST 15 - 41 U/L 21  ALT 14 - 54 U/L 15    Discharge instruction: per After Visit Summary and "Baby and Me Booklet".  After visit meds:  Allergies as of 05/20/2017   No Known Allergies     Medication List    STOP taking these medications   acetaminophen 325 MG tablet Commonly known as:  TYLENOL     TAKE these medications   ibuprofen 600 MG tablet Commonly known as:  ADVIL,MOTRIN Take 1 tablet (600 mg total) by mouth every 6 (  six) hours.   PRENATAL VITAMIN PO Take 1 tablet by mouth daily.       Diet: routine diet  Activity: Advance as tolerated. Pelvic rest for 6 weeks.   Outpatient follow up:2 weeks Follow up Appt:No future appointments. Follow up Visit:No Follow-up on file.  Postpartum contraception: Undecided  Newborn Data: Live born female  Birth Weight: 7 lb 13.8 oz (3565 g) APGAR: 9,   Newborn Delivery   Birth date/time:  05/17/2017 07:30:00 Delivery type:  C-Section, Low Transverse C-section categorization:  Primary     Baby Feeding: Breast Disposition:may be rooming in with baby if baby not discharged   05/20/2017 Rhea PinkLori A Reyansh Kushnir, CNM

## 2017-05-20 NOTE — Lactation Note (Signed)
This note was copied from a baby's chart. Lactation Consultation Note  Patient Name: Boy Emmaline LifeLauren Gehrig QMVHQ'IToday's Date: 05/20/2017   Baby 74 hours old.  Stools transitioned to green.   Family will call for LC to observe next feeding. Answered questions and encouraged bf on both breasts per feeding. RN will set up DEBP.     Maternal Data    Feeding Feeding Type: Breast Fed  LATCH Score Latch: Grasps breast easily, tongue down, lips flanged, rhythmical sucking.  Audible Swallowing: A few with stimulation  Type of Nipple: Everted at rest and after stimulation  Comfort (Breast/Nipple): Filling, red/small blisters or bruises, mild/mod discomfort  Hold (Positioning): No assistance needed to correctly position infant at breast.  LATCH Score: 8  Interventions Interventions: Breast feeding basics reviewed;Assisted with latch;Skin to skin;Breast massage;Hand express;Breast compression;Support pillows;Expressed milk  Lactation Tools Discussed/Used     Consult Status      Hardie PulleyBerkelhammer, Eyana Stolze Boschen 05/20/2017, 9:57 AM

## 2017-05-20 NOTE — Lactation Note (Signed)
This note was copied from a baby's chart. Lactation Consultation Note  Patient Name: Jackie Price: 05/20/2017   Attempted latching but baby sleepy. Assisted mother w/ pumping and reviewed milk storage and cleaning. Demonstrated how to finger syringe feeding volume to baby. Mother pumped approx 29 ml. Encouraged her to give volume with next feeding. Recommend mother post pump 4-6 times per day for 10-20 min with DEBP on initiation setting. Reviewed engorgement care and monitoring voids/stools. Answered questions and encouraged parents.     Maternal Data    Feeding    LATCH Score                   Interventions    Lactation Tools Discussed/Used     Consult Status      Jackie Price, Jackie Price 05/20/2017, 12:03 PM

## 2017-05-20 NOTE — Discharge Instructions (Signed)

## 2017-05-21 ENCOUNTER — Ambulatory Visit: Payer: Self-pay

## 2017-05-21 NOTE — Lactation Note (Signed)
This note was copied from a baby's chart. Lactation Consultation Note  Patient Name: Jackie Price'UToday's Date: 05/21/2017 Reason for consult: Follow-up assessment   Baby 374 days old with increase in weight.   Mother has been breastfeeding and supplementing after with her breastmilk. Mother has been pumping 50-5760ml +.   Praised her for her efforts. Discussed milk storage and lactation support services.   Maternal Data    Feeding    LATCH Score                   Interventions    Lactation Tools Discussed/Used     Consult Status Consult Status: Complete    Hardie PulleyBerkelhammer, [place] Marte Boschen 05/21/2017, 9:54 AM

## 2017-06-07 ENCOUNTER — Ambulatory Visit: Payer: Self-pay

## 2017-06-07 NOTE — Lactation Note (Signed)
This note was copied from a baby's chart. Called and spoke with Jackie DandyAshley Dischinger, MD She gave me some information on Jackie Price. Then called mom to speak with her about feedings.  Called and spoke with mom via phone in infant's room. Mom reports she increased feedings on Friday to every 2 hours during the day and every 3 hours at night because of weight and per Ped advice. Previously mom was allowing infant to sleep longer at night but feeding every 2 hours during the day.  Sometimes infant awakens easily at times for feedings and is very difficult at others. Discussed that infant is tired and needs some time to sleep.   Mom reports once she increased feedings mom noted that sometimes he eats for 8-10 minutes at a time and sometimes for 20-40 minutes per feeding. He was previously eating for 20-40 minutes for most feedings. Infant is usually sleepy at the breast. She says sometimes he is more active at the breast. Mom denies nipple pain with feedings and reports her nipple is rounded and more elongated post BF.   Mom has been pumping more since Jackie SimmeringWaylon has been admitted to the hospital about 8 x a day, previously pumping 2-3 x a day. Mom using Medela PIS at home, she is using the Symphony pump in the hospital. Mom has been pumping about 2-3 ounces per pumping. Amounts have decreased as the day has progressed, which may be normal based on time of day or may indicate that supply is diminished. Mom is pumping 15-20 minutes. She pumps one breast at a time as she has difficulty pumping both at the same time due to size. Enc mom to relax with pumping, hands on expression, and hand expression post pumping.   Mom has started supplementing infant more with the bottle since admitted to the hospital. Mom is having difficulty latching him to the breast due to wires and nasal cannula. Infant will take up to 60 ml in about 20 minutes per mom from the bottle. He sometimes needs breaks to be able to finish bottle.   Mom used  a NUK nipple at home and infant was noted to have vomiting about 30 minutes after feeding. Mom reports it is a projectile vomiting. She is using the slow flow nipple disposable hospital nipple in the hospital and vomiting is better. He is still spitting more with the bottle that he does with the breast. May be that infant with decreased volumes at the breast based on infant weight. It takes [facility] about 20 minutes to take the bottle, sometimes he leaves a 1/2-1 oz and mom will finish in 30 minutes. Discussed with mom importance of trying to get infant to finish feeding in 30-40 minutes. Then allowing uninterrupted rest between feedings. Discussed infant may not have the energy to feed every 2 hours as he is only getting an hour of sleep between feedings most of the time. Discussed allowing infant to go 3 hours between feeds counting from the beginning of the feeding to the beginning of the next feeding. Infant should also be fed with feeding cues is he wants to eat more often than every 3 hours he should be allowed to do so.   Discussed with mom it is not uncommon for infants with heart conditions to need increased calories added to their milk for growth. Especially when infant having increased WOB. Discussed that if mom's milk supply is not enough for Jackie Price that formula will also need to be added. Also discussed with  mom and MD about possibility of adding Human Milk Fortifier to her EBM for added calories as needed. Mom seemed agreeable to formula and HMF if needed. Discussed importance of calories for Jackie Price due to poor growth patterns so far. Discussed with mom that due to Lasix and diuresing, infant may actually lose weight with the next weight.   Discussed with mom that due to poor weight gain, that infant may not have been transferring well from the breast since birth and that her milk supply may not be adequate at this time to feed infant fully, that will be determined in time as she pumps and volumes  are determined. Discussed using Fenugreek in addition to pumping to try and increase supply.   Plan of care that is recommended at this time was reviewed with mom and Dr. Jethro Poling via phone. Discussed with mom that that Plan of Care is ultimately the decision of the MD caring for Jackie Price and these are recommendations that may need to change over time depending on growth and medical condition, mom voiced understanding.   1. Breast feed infant with each feeding for 15 minutes maximum as mom and infant able. If infant not showing interest in BF, stop and supplement with bottle.  2. Feed infant with feeding cues with no longer than 3 hours between feedings. For added rest, allow infant to go 3 hours between feeds if he is not cueing to feed.  3. Mom to pump 8 x a day with Symphony DEBP for 15-20 minutes per breast using hands on pumping. Follow with hand expression.  4. Jackie Price needs 64-85 ml of EBM or formula every 3 hours 5. Infant may need Human Milk Fortifier added to breast milk if unable to take volumes per above, has poor growth, and/or continues to have fluid retention and needs fluid restrictions.  6. If not enough EBM is available infant may need formula as a supplement and if not able to take specified volumes or not growing on specified volumes may need higher calorie formula (22-24 calorie or higher)  7. Would like for infant to switch to Dr. Theora Gianotti Level 1 nipple for feedings. Mom is aware and if hospital unable to obtain bottle, mom will send someone to store to purchase. Dr. Jethro Poling to ask about obtaining Dr. Theora Gianotti Bottle through hospital.  8. Advised mom to consider taking Fenugreek 3-610 ml capsules 3 x a day to help increase milk supply.  9. Follow up with OP Lactation visit after discharge to assess milk transfer and milk supply.  10. LC to call and follow up with mom tomorrow.  11. Please call Lactation Department back if have further questions/concerns [phone] 12.  Thank you for allowing me to assist you today.

## 2017-06-08 ENCOUNTER — Encounter (HOSPITAL_COMMUNITY): Payer: Self-pay | Admitting: *Deleted

## 2017-06-08 ENCOUNTER — Ambulatory Visit: Payer: Self-pay

## 2017-06-08 NOTE — Lactation Note (Signed)
This note was copied from a baby's chart. Called and spoke with mom about how the night went. Mom reports feeding went better.   Mom reports infant breast fed for 10-15 minutes on the breast and then took 60 cc via the bottle about every 3 hours. Mom reports he tires at around 60 cc. It takes him about 17-20 minutes to take the bottle. Mom reports he did much better with the Dr. Theora GianottiBrown's bottle. He spit x 1 last night.   Mom is pumping every 3 hours and obtained 3 oz this morning. She is getting more like the 2 ounces as the day progresses. She is pumping every 3 hours. Mom started Fenugreek 3 capsules TID last night.   Mom reports infant slept better last night and he is off his O2.   Mom reports that due to weight loss they are going to add Neosure powder to her milk to increase to 24 calorie breast milk.   Mom reports the current plan is for him to go home on Friday. Mom would like to return for OP Lactation visit on Monday as infant has to be seen by Cardiologist on Monday.   Mom to call with further questions. LC to call mom tomorrow to follow up on when Cardiology appt is to be able to schedule LC appt.  Mom reports all questions have been answered at this time and she is good with the plan as it is.

## 2017-06-09 ENCOUNTER — Ambulatory Visit: Payer: Self-pay

## 2017-06-09 NOTE — Lactation Note (Signed)
This note was copied from a baby's chart. Called to speak with mom on Peds unit to assess how feedings are going.   Mom reports feedings went pretty good last night. He did vomit a large amount x 1 last night. Mom did try to increase amounts and that is when he spit.   Mom reports pumping is going well and she is able to pump enough to feed him. She does have some milk at home in the freezer that she is using as needed. Mom is thawing milk in cup of warm water, she was being told by a staff member while I was on the phone that she should be using a bottle warmer. Informed mom it is ok to use a cup of warm water to thaw and heat breast milk.   Mom has been able to pump more volume today. She is pumping at least 8 x a day.   Mom reports the Nurse fed infant for one of the feedings and mom was able to rest. She reports that she feels better. Enc mom to allow someone else to give a night time feeding and mom rest. Enc mom to change pumping schedule to every 2-3 hours during the day to allow for a 4-5 hour stretch at night to rest.    Cardiology appt is on Monday at 9 am. Mom is now ready to set up OP Lactation appt. Told her I would inform OP clinic to call and schedule LC appt. Mom said ok. In basket [facility].

## 2017-08-02 DIAGNOSIS — Z124 Encounter for screening for malignant neoplasm of cervix: Secondary | ICD-10-CM | POA: Diagnosis not present

## 2017-11-15 IMAGING — US US MFM OB DETAIL+14 WK
1 series · 14 of 28 positions shown · non-contrast
Comparison: none

[Series 1: us mfm ob detail+14 wk · 91 acquisitions, 14 frames shown]
[im 4/91]
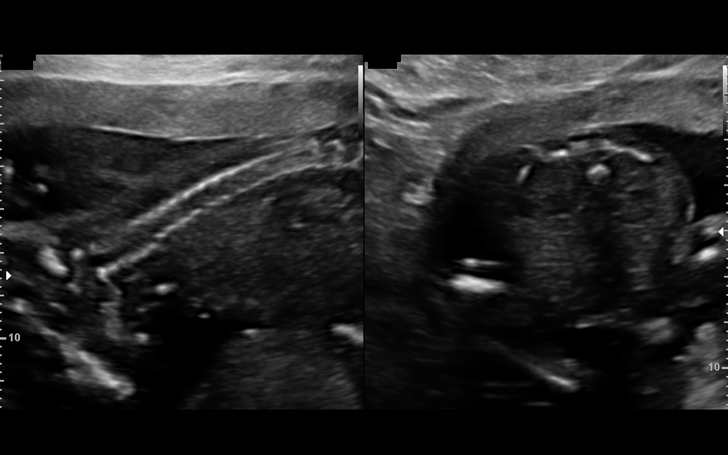
[im 11/91]
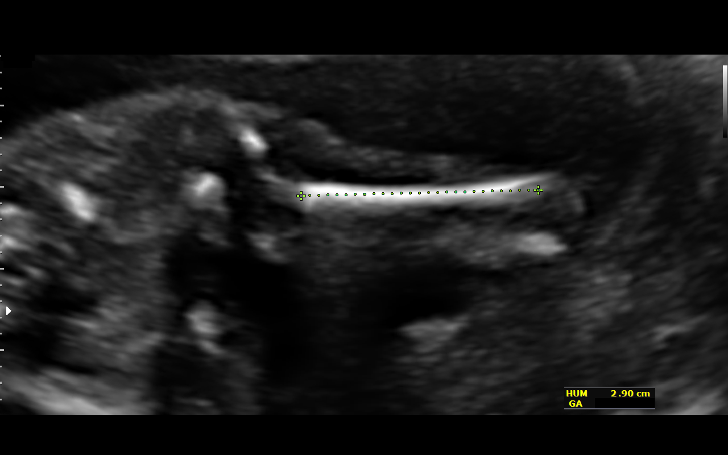
[im 17/91]
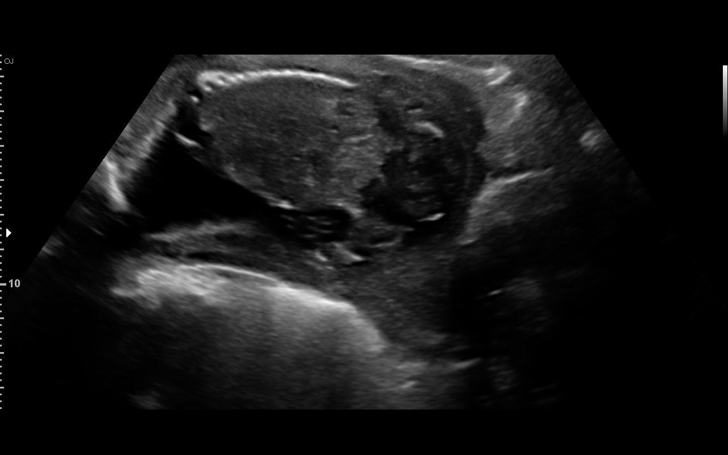
[im 24/91]
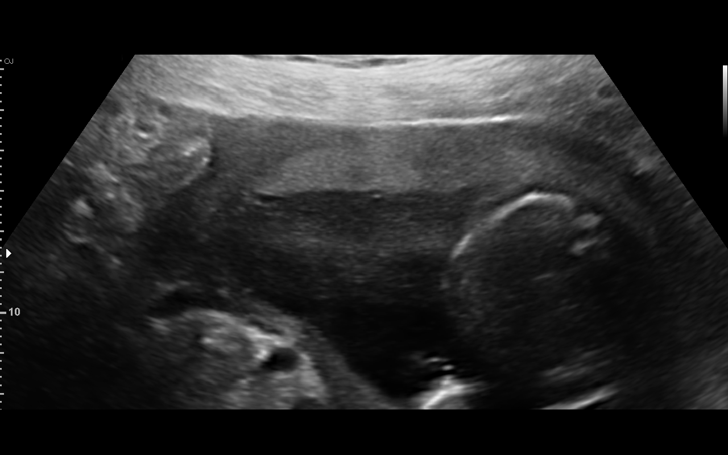
[im 31/91]
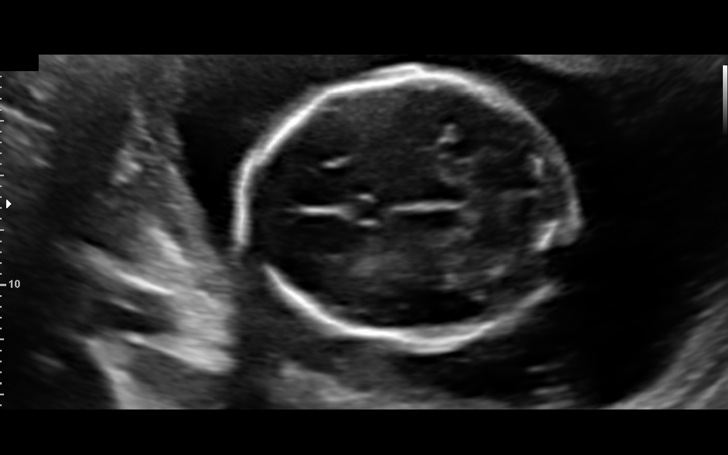
[im 37/91]
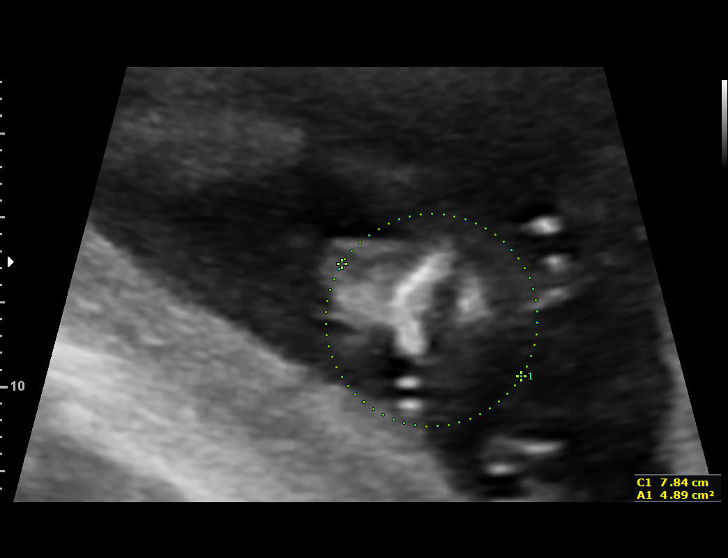
[im 44/91]
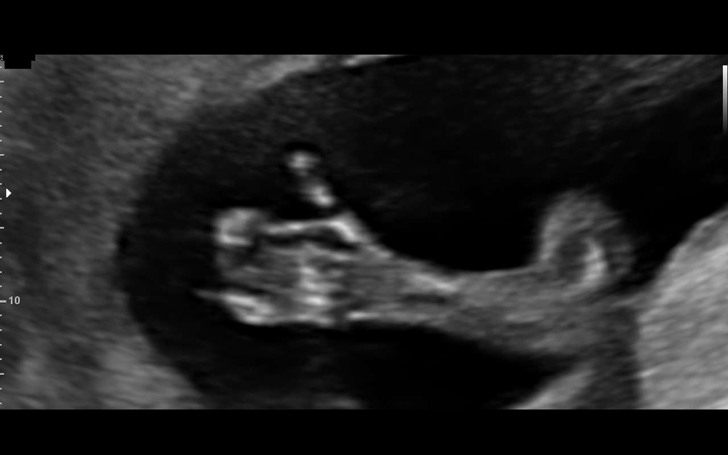
[im 51/91]
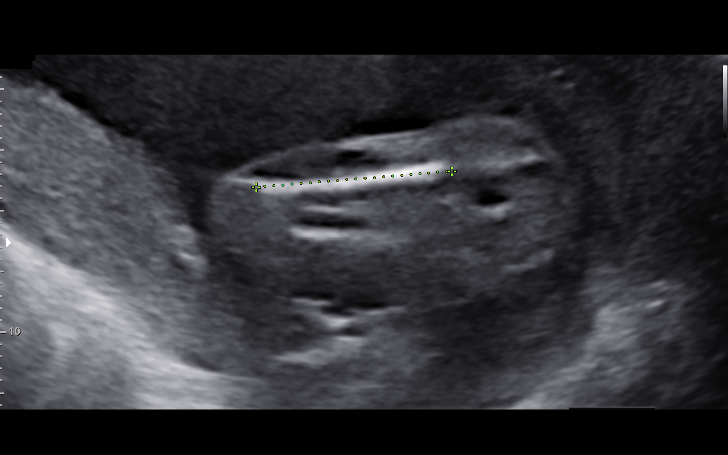
[im 57/91]
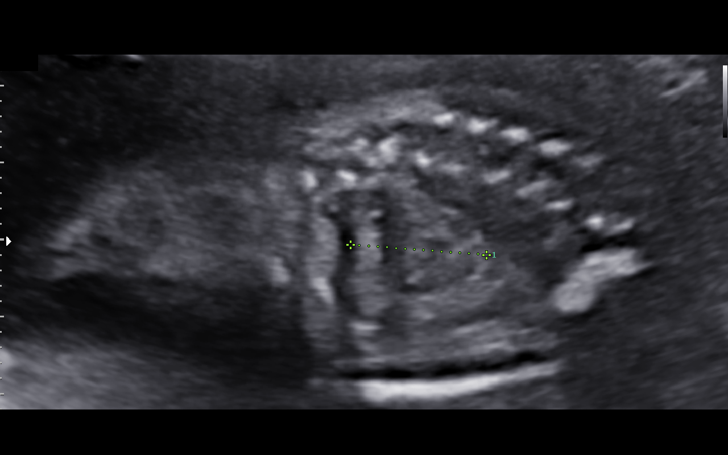
[im 64/91]
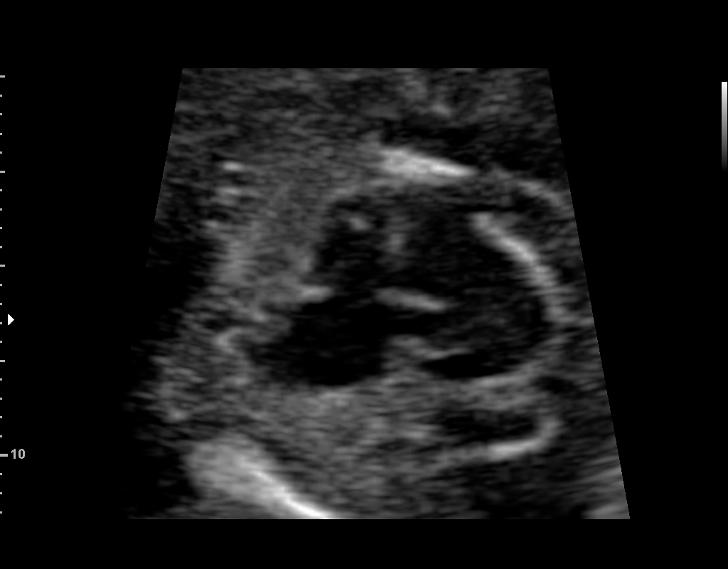
[im 71/91]
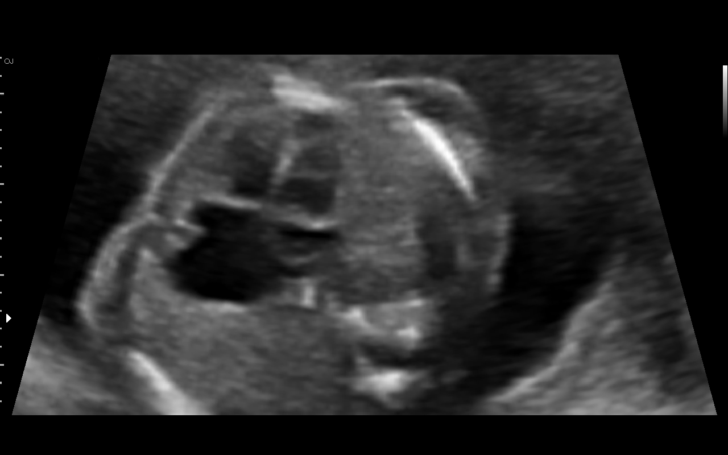
[im 77/91]
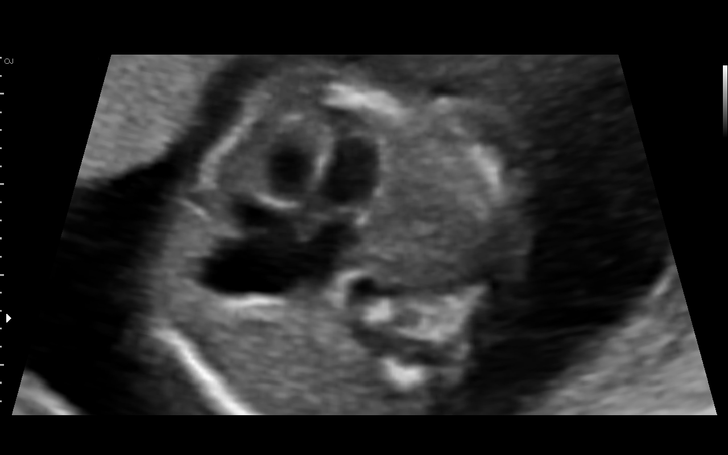
[im 84/91]
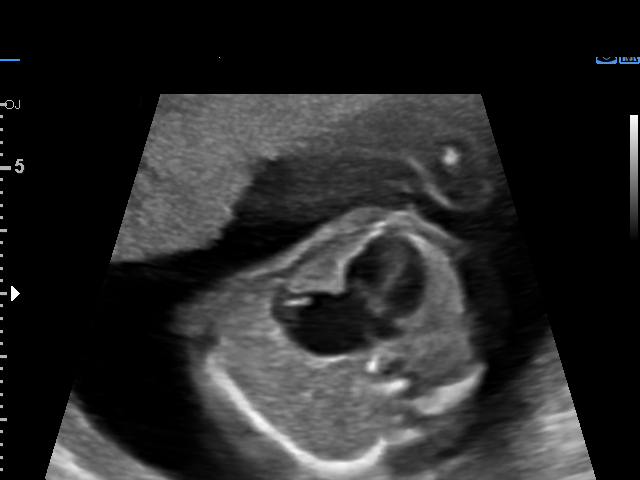
[im 91/91]
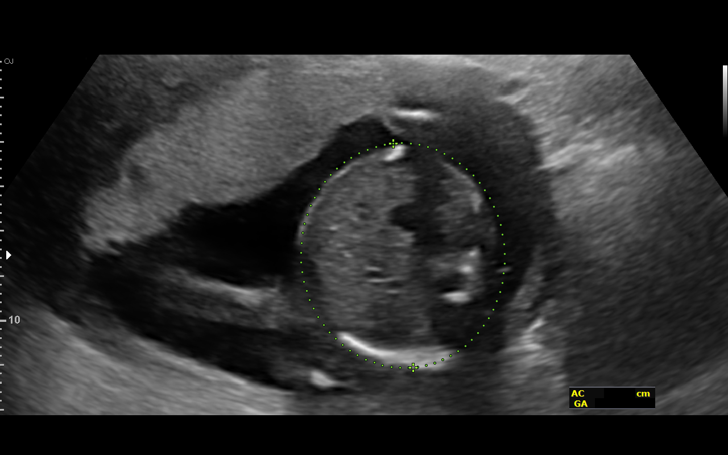

[14 of 28 positions shown; findings below may reference images not displayed]

Obstetrics &
Gynecology
[address]
[place]

1  [facility] [place]           [id number]      [id number]     [id number]
Indications

19 weeks gestation of pregnancy
Encounter for antenatal screening for
malformations
Family history of genetic disorder (FOB w/
Generino Hilbert syndrome)
OB History

Blood Type:            Height:  5'3"   Weight (lb):  258       BMI:
Gravidity:    1         Term:   0        Prem:   0        SAB:   0
TOP:          0       Ectopic:  0        Living: 0
Fetal Evaluation

Num Of Fetuses:     1
Cardiac Activity:   Observed
Presentation:       Breech
Placenta:           Anterior, above cervical os
P. Cord Insertion:  Marginal insertion

Amniotic Fluid
AFI FV:      Subjectively within normal limits

Largest Pocket(cm)
5.34
Biometry
BPD:      47.8  mm     G. Age:  20w 3d         79  %    CI:        78.79   %    70 - 86
FL/HC:      19.1   %    16.8 -
HC:      170.3  mm     G. Age:  19w 4d         39  %    HC/AC:      1.12        1.09 -
AC:      152.7  mm     G. Age:  20w 3d         70  %    FL/BPD:     68.0   %
FL:       32.5  mm     G. Age:  20w 1d         58  %    FL/AC:      21.3   %    20 - 24
HUM:      30.3  mm     G. Age:  20w 0d         60  %
CER:      20.2  mm     G. Age:  19w 1d         40  %
NFT:       4.7  mm

CM:        6.5  mm

Est. FW:     342  gm    0 lb 12 oz      56  %
Gestational Age

LMP:           20w 5d        Date:  08/07/16                 EDD:   05/14/17
Clinical EDD:  19w 5d                                        EDD:   05/21/17
U/S Today:     20w 1d                                        EDD:   05/18/17
Best:          19w 5d     Det. By:  Clinical EDD             EDD:   05/21/17
Anatomy

Cranium:               Appears normal         Aortic Arch:            Appears normal
Cavum:                 Appears normal         Ductal Arch:            Appears normal
Ventricles:            Appears normal         Diaphragm:              Appears normal
Choroid Plexus:        Appears normal         Stomach:                Appears normal, left
sided
Cerebellum:            Appears normal         Abdomen:                Appears normal
Posterior Fossa:       Appears normal         Abdominal Wall:         Appears nml (cord
insert, abd wall)
Nuchal Fold:           Appears normal         Cord Vessels:           Appears normal (3
vessel cord)
Face:                  Appears normal         Kidneys:                Appear normal
(orbits and profile)
Lips:                  Appears normal         Bladder:                Appears normal
Thoracic:              Appears normal         Spine:                  Appears normal
Heart:                 Abnormal, see          Upper Extremities:      Visualized
comments
RVOT:                  Appears normal         Lower Extremities:      Visualized
LVOT:                  Not well visualized

Other:  Male gender. Technically difficult due to maternal habitus and fetal
position.
Cervix Uterus Adnexa

Cervix
Length:            3.1  cm.
Normal appearance by transabdominal scan.

Uterus
No abnormality visualized.
Impression

Singleton intrauterine pregnancy at 19+5 weeks with family
history Holt-Oram Syndrome
Review of the anatomy shows an enlarged right right atrium.
Ventricles appear normal. Otherwise no sonographic markers
for aneuploidy or structural anomalies are seen
Both upper extremities appear normal
Amniotic fluid volume is normal
Estimated fetal weight is 342g which is growth in the 56th
percentile
Recommendations

See MFM consult

## 2019-07-21 ENCOUNTER — Ambulatory Visit: Payer: Self-pay | Attending: Family

## 2019-07-21 DIAGNOSIS — Z23 Encounter for immunization: Secondary | ICD-10-CM

## 2019-07-21 NOTE — Progress Notes (Signed)
   Covid-19 Vaccination Clinic  Name:  Jackie Price    MRN: [id number] DOB: Sep 07, 1985  07/21/2019  [name] was observed post Covid-19 immunization for 15 minutes without incident. She was provided with Vaccine Information Sheet and instruction to access the V-Safe system.   [name] was instructed to call 911 with any severe reactions post vaccine: Marland Kitchen Difficulty breathing  . Swelling of face and throat  . A fast heartbeat  . A bad rash all over body  . Dizziness and weakness   Immunizations Administered    Name Date Dose VIS Date Route   JANSSEN COVID-19 VACCINE 07/21/2019 11:51 AM 0.5 mL 06/16/2019 Intramuscular   Manufacturer: [employer]   Lot: [id number]   NDC: 55015-868-25
